# Patient Record
Sex: Male | Born: 1945 | Race: White | Hispanic: No | Marital: Married | Smoking: Former smoker
Health system: Southern US, Community
[De-identification: ages and names within clinical notes are randomized; demographics above are authoritative.]

## PROBLEM LIST (undated history)

## (undated) DIAGNOSIS — G8250 Quadriplegia, unspecified: Secondary | ICD-10-CM

## (undated) DIAGNOSIS — N529 Male erectile dysfunction, unspecified: Secondary | ICD-10-CM

## (undated) DIAGNOSIS — I2510 Atherosclerotic heart disease of native coronary artery without angina pectoris: Secondary | ICD-10-CM

## (undated) DIAGNOSIS — K5730 Diverticulosis of large intestine without perforation or abscess without bleeding: Secondary | ICD-10-CM

## (undated) DIAGNOSIS — R011 Cardiac murmur, unspecified: Secondary | ICD-10-CM

## (undated) DIAGNOSIS — M47812 Spondylosis without myelopathy or radiculopathy, cervical region: Secondary | ICD-10-CM

## (undated) DIAGNOSIS — K219 Gastro-esophageal reflux disease without esophagitis: Secondary | ICD-10-CM

## (undated) DIAGNOSIS — B029 Zoster without complications: Secondary | ICD-10-CM

## (undated) DIAGNOSIS — M5430 Sciatica, unspecified side: Secondary | ICD-10-CM

## (undated) DIAGNOSIS — M479 Spondylosis, unspecified: Secondary | ICD-10-CM

## (undated) DIAGNOSIS — I639 Cerebral infarction, unspecified: Secondary | ICD-10-CM

## (undated) DIAGNOSIS — D1803 Hemangioma of intra-abdominal structures: Secondary | ICD-10-CM

## (undated) DIAGNOSIS — M7061 Trochanteric bursitis, right hip: Secondary | ICD-10-CM

## (undated) HISTORY — DX: Sciatica, unspecified side: M54.30

## (undated) HISTORY — PX: TENDON REPAIR: SHX5111

## (undated) HISTORY — DX: Trochanteric bursitis, right hip: M70.61

## (undated) HISTORY — PX: CORONARY ANGIOPLASTY WITH STENT PLACEMENT: SHX49

## (undated) HISTORY — PX: PATENT FORAMEN OVALE CLOSURE: SHX2181

## (undated) HISTORY — DX: Quadriplegia, unspecified: G82.50

## (undated) HISTORY — DX: Diverticulosis of large intestine without perforation or abscess without bleeding: K57.30

## (undated) HISTORY — PX: HAND SURGERY: SHX662

## (undated) HISTORY — PX: KNEE ARTHROSCOPY W/ MENISCAL REPAIR: SHX1877

## (undated) HISTORY — DX: Gastro-esophageal reflux disease without esophagitis: K21.9

## (undated) HISTORY — DX: Spondylosis, unspecified: M47.9

## (undated) HISTORY — DX: Male erectile dysfunction, unspecified: N52.9

## (undated) HISTORY — DX: Hemangioma of intra-abdominal structures: D18.03

## (undated) HISTORY — PX: OTHER SURGICAL HISTORY: SHX169

## (undated) HISTORY — DX: Zoster without complications: B02.9

## (undated) HISTORY — DX: Spondylosis without myelopathy or radiculopathy, cervical region: M47.812

## (undated) HISTORY — PX: COLONOSCOPY: SHX174

---

## 1998-07-06 ENCOUNTER — Encounter: Payer: Self-pay | Admitting: Internal Medicine

## 1998-07-06 ENCOUNTER — Ambulatory Visit (HOSPITAL_COMMUNITY): Admission: RE | Admit: 1998-07-06 | Discharge: 1998-07-06 | Payer: Self-pay | Admitting: Internal Medicine

## 1998-07-12 ENCOUNTER — Encounter: Payer: Self-pay | Admitting: Neurosurgery

## 1998-07-12 ENCOUNTER — Ambulatory Visit (HOSPITAL_COMMUNITY): Admission: RE | Admit: 1998-07-12 | Discharge: 1998-07-12 | Payer: Self-pay | Admitting: Neurosurgery

## 1999-03-31 ENCOUNTER — Encounter: Payer: Self-pay | Admitting: Neurosurgery

## 1999-03-31 ENCOUNTER — Encounter: Admission: RE | Admit: 1999-03-31 | Discharge: 1999-03-31 | Payer: Self-pay | Admitting: Neurosurgery

## 2003-07-01 ENCOUNTER — Ambulatory Visit (HOSPITAL_COMMUNITY): Admission: RE | Admit: 2003-07-01 | Discharge: 2003-07-01 | Payer: Self-pay | Admitting: Gastroenterology

## 2004-05-14 ENCOUNTER — Encounter: Admission: RE | Admit: 2004-05-14 | Discharge: 2004-05-14 | Payer: Self-pay | Admitting: Internal Medicine

## 2004-05-19 ENCOUNTER — Ambulatory Visit (HOSPITAL_COMMUNITY): Admission: RE | Admit: 2004-05-19 | Discharge: 2004-05-19 | Payer: Self-pay | Admitting: Internal Medicine

## 2004-10-27 ENCOUNTER — Encounter: Admission: RE | Admit: 2004-10-27 | Discharge: 2004-10-27 | Payer: Self-pay | Admitting: Internal Medicine

## 2011-07-16 DIAGNOSIS — Z1331 Encounter for screening for depression: Secondary | ICD-10-CM | POA: Diagnosis not present

## 2011-07-16 DIAGNOSIS — Z136 Encounter for screening for cardiovascular disorders: Secondary | ICD-10-CM | POA: Diagnosis not present

## 2011-07-16 DIAGNOSIS — Z125 Encounter for screening for malignant neoplasm of prostate: Secondary | ICD-10-CM | POA: Diagnosis not present

## 2011-07-16 DIAGNOSIS — R030 Elevated blood-pressure reading, without diagnosis of hypertension: Secondary | ICD-10-CM | POA: Diagnosis not present

## 2011-07-16 DIAGNOSIS — R0609 Other forms of dyspnea: Secondary | ICD-10-CM | POA: Diagnosis not present

## 2011-07-16 DIAGNOSIS — Z0000 Encounter for general adult medical examination without abnormal findings: Secondary | ICD-10-CM | POA: Diagnosis not present

## 2011-07-16 DIAGNOSIS — Z23 Encounter for immunization: Secondary | ICD-10-CM | POA: Diagnosis not present

## 2011-07-16 DIAGNOSIS — K219 Gastro-esophageal reflux disease without esophagitis: Secondary | ICD-10-CM | POA: Diagnosis not present

## 2011-07-16 DIAGNOSIS — R0989 Other specified symptoms and signs involving the circulatory and respiratory systems: Secondary | ICD-10-CM | POA: Diagnosis not present

## 2011-08-13 DIAGNOSIS — I10 Essential (primary) hypertension: Secondary | ICD-10-CM | POA: Diagnosis not present

## 2012-01-06 DIAGNOSIS — K219 Gastro-esophageal reflux disease without esophagitis: Secondary | ICD-10-CM | POA: Diagnosis not present

## 2012-01-06 DIAGNOSIS — K589 Irritable bowel syndrome without diarrhea: Secondary | ICD-10-CM | POA: Diagnosis not present

## 2012-02-18 DIAGNOSIS — E785 Hyperlipidemia, unspecified: Secondary | ICD-10-CM | POA: Diagnosis not present

## 2012-02-18 DIAGNOSIS — R030 Elevated blood-pressure reading, without diagnosis of hypertension: Secondary | ICD-10-CM | POA: Diagnosis not present

## 2012-02-18 DIAGNOSIS — Z23 Encounter for immunization: Secondary | ICD-10-CM | POA: Diagnosis not present

## 2012-07-14 DIAGNOSIS — M25579 Pain in unspecified ankle and joints of unspecified foot: Secondary | ICD-10-CM | POA: Diagnosis not present

## 2012-07-14 DIAGNOSIS — M2140 Flat foot [pes planus] (acquired), unspecified foot: Secondary | ICD-10-CM | POA: Diagnosis not present

## 2012-08-04 DIAGNOSIS — E785 Hyperlipidemia, unspecified: Secondary | ICD-10-CM | POA: Diagnosis not present

## 2012-08-04 DIAGNOSIS — R030 Elevated blood-pressure reading, without diagnosis of hypertension: Secondary | ICD-10-CM | POA: Diagnosis not present

## 2012-08-11 DIAGNOSIS — M79609 Pain in unspecified limb: Secondary | ICD-10-CM | POA: Diagnosis not present

## 2012-08-11 DIAGNOSIS — Z1331 Encounter for screening for depression: Secondary | ICD-10-CM | POA: Diagnosis not present

## 2012-08-11 DIAGNOSIS — N529 Male erectile dysfunction, unspecified: Secondary | ICD-10-CM | POA: Diagnosis not present

## 2012-08-11 DIAGNOSIS — Z0000 Encounter for general adult medical examination without abnormal findings: Secondary | ICD-10-CM | POA: Diagnosis not present

## 2012-08-11 DIAGNOSIS — M25579 Pain in unspecified ankle and joints of unspecified foot: Secondary | ICD-10-CM | POA: Diagnosis not present

## 2012-08-17 DIAGNOSIS — M216X9 Other acquired deformities of unspecified foot: Secondary | ICD-10-CM | POA: Diagnosis not present

## 2012-08-17 DIAGNOSIS — G5730 Lesion of lateral popliteal nerve, unspecified lower limb: Secondary | ICD-10-CM | POA: Diagnosis not present

## 2012-08-22 DIAGNOSIS — M47817 Spondylosis without myelopathy or radiculopathy, lumbosacral region: Secondary | ICD-10-CM | POA: Diagnosis not present

## 2012-08-23 DIAGNOSIS — M25676 Stiffness of unspecified foot, not elsewhere classified: Secondary | ICD-10-CM | POA: Diagnosis not present

## 2012-08-23 DIAGNOSIS — M6281 Muscle weakness (generalized): Secondary | ICD-10-CM | POA: Diagnosis not present

## 2012-08-23 DIAGNOSIS — M25579 Pain in unspecified ankle and joints of unspecified foot: Secondary | ICD-10-CM | POA: Diagnosis not present

## 2012-08-23 DIAGNOSIS — M216X9 Other acquired deformities of unspecified foot: Secondary | ICD-10-CM | POA: Diagnosis not present

## 2012-08-23 DIAGNOSIS — M25673 Stiffness of unspecified ankle, not elsewhere classified: Secondary | ICD-10-CM | POA: Diagnosis not present

## 2012-08-25 DIAGNOSIS — M216X9 Other acquired deformities of unspecified foot: Secondary | ICD-10-CM | POA: Diagnosis not present

## 2012-08-25 DIAGNOSIS — M6281 Muscle weakness (generalized): Secondary | ICD-10-CM | POA: Diagnosis not present

## 2012-08-25 DIAGNOSIS — M25676 Stiffness of unspecified foot, not elsewhere classified: Secondary | ICD-10-CM | POA: Diagnosis not present

## 2012-08-25 DIAGNOSIS — M25579 Pain in unspecified ankle and joints of unspecified foot: Secondary | ICD-10-CM | POA: Diagnosis not present

## 2012-08-25 DIAGNOSIS — M25673 Stiffness of unspecified ankle, not elsewhere classified: Secondary | ICD-10-CM | POA: Diagnosis not present

## 2012-08-29 DIAGNOSIS — M25579 Pain in unspecified ankle and joints of unspecified foot: Secondary | ICD-10-CM | POA: Diagnosis not present

## 2012-08-29 DIAGNOSIS — M6281 Muscle weakness (generalized): Secondary | ICD-10-CM | POA: Diagnosis not present

## 2012-08-29 DIAGNOSIS — M216X9 Other acquired deformities of unspecified foot: Secondary | ICD-10-CM | POA: Diagnosis not present

## 2012-08-29 DIAGNOSIS — M25673 Stiffness of unspecified ankle, not elsewhere classified: Secondary | ICD-10-CM | POA: Diagnosis not present

## 2012-08-31 DIAGNOSIS — M25579 Pain in unspecified ankle and joints of unspecified foot: Secondary | ICD-10-CM | POA: Diagnosis not present

## 2012-08-31 DIAGNOSIS — M6281 Muscle weakness (generalized): Secondary | ICD-10-CM | POA: Diagnosis not present

## 2012-08-31 DIAGNOSIS — M216X9 Other acquired deformities of unspecified foot: Secondary | ICD-10-CM | POA: Diagnosis not present

## 2012-08-31 DIAGNOSIS — M25676 Stiffness of unspecified foot, not elsewhere classified: Secondary | ICD-10-CM | POA: Diagnosis not present

## 2012-08-31 DIAGNOSIS — M25673 Stiffness of unspecified ankle, not elsewhere classified: Secondary | ICD-10-CM | POA: Diagnosis not present

## 2012-09-12 DIAGNOSIS — M216X9 Other acquired deformities of unspecified foot: Secondary | ICD-10-CM | POA: Diagnosis not present

## 2012-09-12 DIAGNOSIS — M25676 Stiffness of unspecified foot, not elsewhere classified: Secondary | ICD-10-CM | POA: Diagnosis not present

## 2012-09-12 DIAGNOSIS — M25579 Pain in unspecified ankle and joints of unspecified foot: Secondary | ICD-10-CM | POA: Diagnosis not present

## 2012-09-12 DIAGNOSIS — M25673 Stiffness of unspecified ankle, not elsewhere classified: Secondary | ICD-10-CM | POA: Diagnosis not present

## 2012-09-12 DIAGNOSIS — M6281 Muscle weakness (generalized): Secondary | ICD-10-CM | POA: Diagnosis not present

## 2012-09-14 DIAGNOSIS — M25579 Pain in unspecified ankle and joints of unspecified foot: Secondary | ICD-10-CM | POA: Diagnosis not present

## 2012-09-14 DIAGNOSIS — M216X9 Other acquired deformities of unspecified foot: Secondary | ICD-10-CM | POA: Diagnosis not present

## 2012-09-14 DIAGNOSIS — M6281 Muscle weakness (generalized): Secondary | ICD-10-CM | POA: Diagnosis not present

## 2012-09-14 DIAGNOSIS — M25676 Stiffness of unspecified foot, not elsewhere classified: Secondary | ICD-10-CM | POA: Diagnosis not present

## 2012-09-28 DIAGNOSIS — M6281 Muscle weakness (generalized): Secondary | ICD-10-CM | POA: Diagnosis not present

## 2012-09-28 DIAGNOSIS — M25676 Stiffness of unspecified foot, not elsewhere classified: Secondary | ICD-10-CM | POA: Diagnosis not present

## 2012-09-28 DIAGNOSIS — M216X9 Other acquired deformities of unspecified foot: Secondary | ICD-10-CM | POA: Diagnosis not present

## 2012-09-28 DIAGNOSIS — M25673 Stiffness of unspecified ankle, not elsewhere classified: Secondary | ICD-10-CM | POA: Diagnosis not present

## 2012-09-28 DIAGNOSIS — M25579 Pain in unspecified ankle and joints of unspecified foot: Secondary | ICD-10-CM | POA: Diagnosis not present

## 2012-11-14 DIAGNOSIS — S93499A Sprain of other ligament of unspecified ankle, initial encounter: Secondary | ICD-10-CM | POA: Diagnosis not present

## 2012-11-14 DIAGNOSIS — S96819A Strain of other specified muscles and tendons at ankle and foot level, unspecified foot, initial encounter: Secondary | ICD-10-CM | POA: Diagnosis not present

## 2012-11-18 DIAGNOSIS — S96819A Strain of other specified muscles and tendons at ankle and foot level, unspecified foot, initial encounter: Secondary | ICD-10-CM | POA: Diagnosis not present

## 2012-11-21 DIAGNOSIS — S8990XA Unspecified injury of unspecified lower leg, initial encounter: Secondary | ICD-10-CM | POA: Diagnosis not present

## 2012-11-21 DIAGNOSIS — S99929A Unspecified injury of unspecified foot, initial encounter: Secondary | ICD-10-CM | POA: Diagnosis not present

## 2012-11-21 DIAGNOSIS — G8918 Other acute postprocedural pain: Secondary | ICD-10-CM | POA: Diagnosis not present

## 2012-11-21 DIAGNOSIS — IMO0002 Reserved for concepts with insufficient information to code with codable children: Secondary | ICD-10-CM | POA: Diagnosis not present

## 2012-11-21 DIAGNOSIS — M6240 Contracture of muscle, unspecified site: Secondary | ICD-10-CM | POA: Diagnosis not present

## 2012-11-21 DIAGNOSIS — M66239 Spontaneous rupture of extensor tendons, unspecified forearm: Secondary | ICD-10-CM | POA: Diagnosis not present

## 2012-12-07 DIAGNOSIS — S96819A Strain of other specified muscles and tendons at ankle and foot level, unspecified foot, initial encounter: Secondary | ICD-10-CM | POA: Diagnosis not present

## 2012-12-07 DIAGNOSIS — S93499A Sprain of other ligament of unspecified ankle, initial encounter: Secondary | ICD-10-CM | POA: Diagnosis not present

## 2012-12-07 DIAGNOSIS — Z4789 Encounter for other orthopedic aftercare: Secondary | ICD-10-CM | POA: Diagnosis not present

## 2012-12-22 DIAGNOSIS — Z4789 Encounter for other orthopedic aftercare: Secondary | ICD-10-CM | POA: Diagnosis not present

## 2012-12-27 DIAGNOSIS — M25579 Pain in unspecified ankle and joints of unspecified foot: Secondary | ICD-10-CM | POA: Diagnosis not present

## 2012-12-29 DIAGNOSIS — M25579 Pain in unspecified ankle and joints of unspecified foot: Secondary | ICD-10-CM | POA: Diagnosis not present

## 2013-01-02 DIAGNOSIS — M25579 Pain in unspecified ankle and joints of unspecified foot: Secondary | ICD-10-CM | POA: Diagnosis not present

## 2013-01-04 DIAGNOSIS — M25579 Pain in unspecified ankle and joints of unspecified foot: Secondary | ICD-10-CM | POA: Diagnosis not present

## 2013-01-09 DIAGNOSIS — M25579 Pain in unspecified ankle and joints of unspecified foot: Secondary | ICD-10-CM | POA: Diagnosis not present

## 2013-01-22 DIAGNOSIS — M25579 Pain in unspecified ankle and joints of unspecified foot: Secondary | ICD-10-CM | POA: Diagnosis not present

## 2013-01-23 DIAGNOSIS — M25579 Pain in unspecified ankle and joints of unspecified foot: Secondary | ICD-10-CM | POA: Diagnosis not present

## 2013-01-24 DIAGNOSIS — H2589 Other age-related cataract: Secondary | ICD-10-CM | POA: Diagnosis not present

## 2013-01-29 DIAGNOSIS — M25579 Pain in unspecified ankle and joints of unspecified foot: Secondary | ICD-10-CM | POA: Diagnosis not present

## 2013-02-05 DIAGNOSIS — M25579 Pain in unspecified ankle and joints of unspecified foot: Secondary | ICD-10-CM | POA: Diagnosis not present

## 2013-03-07 DIAGNOSIS — Z23 Encounter for immunization: Secondary | ICD-10-CM | POA: Diagnosis not present

## 2013-08-17 DIAGNOSIS — N529 Male erectile dysfunction, unspecified: Secondary | ICD-10-CM | POA: Diagnosis not present

## 2013-08-17 DIAGNOSIS — Z125 Encounter for screening for malignant neoplasm of prostate: Secondary | ICD-10-CM | POA: Diagnosis not present

## 2013-08-17 DIAGNOSIS — Z1331 Encounter for screening for depression: Secondary | ICD-10-CM | POA: Diagnosis not present

## 2013-08-17 DIAGNOSIS — Z23 Encounter for immunization: Secondary | ICD-10-CM | POA: Diagnosis not present

## 2013-08-17 DIAGNOSIS — Z136 Encounter for screening for cardiovascular disorders: Secondary | ICD-10-CM | POA: Diagnosis not present

## 2013-08-17 DIAGNOSIS — Z0000 Encounter for general adult medical examination without abnormal findings: Secondary | ICD-10-CM | POA: Diagnosis not present

## 2013-10-05 DIAGNOSIS — K5730 Diverticulosis of large intestine without perforation or abscess without bleeding: Secondary | ICD-10-CM | POA: Diagnosis not present

## 2013-10-05 DIAGNOSIS — Z1211 Encounter for screening for malignant neoplasm of colon: Secondary | ICD-10-CM | POA: Diagnosis not present

## 2013-10-05 LAB — HM COLONOSCOPY

## 2014-04-16 DIAGNOSIS — R200 Anesthesia of skin: Secondary | ICD-10-CM | POA: Diagnosis not present

## 2014-08-28 DIAGNOSIS — Z0000 Encounter for general adult medical examination without abnormal findings: Secondary | ICD-10-CM | POA: Diagnosis not present

## 2014-08-28 DIAGNOSIS — Z1389 Encounter for screening for other disorder: Secondary | ICD-10-CM | POA: Diagnosis not present

## 2014-08-28 DIAGNOSIS — M25551 Pain in right hip: Secondary | ICD-10-CM | POA: Diagnosis not present

## 2014-08-28 DIAGNOSIS — N5201 Erectile dysfunction due to arterial insufficiency: Secondary | ICD-10-CM | POA: Diagnosis not present

## 2014-09-06 DIAGNOSIS — M7071 Other bursitis of hip, right hip: Secondary | ICD-10-CM | POA: Diagnosis not present

## 2014-10-09 DIAGNOSIS — M7061 Trochanteric bursitis, right hip: Secondary | ICD-10-CM | POA: Diagnosis not present

## 2014-11-27 DIAGNOSIS — M25551 Pain in right hip: Secondary | ICD-10-CM | POA: Diagnosis not present

## 2014-11-27 DIAGNOSIS — M7061 Trochanteric bursitis, right hip: Secondary | ICD-10-CM | POA: Diagnosis not present

## 2014-12-18 DIAGNOSIS — K219 Gastro-esophageal reflux disease without esophagitis: Secondary | ICD-10-CM | POA: Diagnosis not present

## 2014-12-18 DIAGNOSIS — R011 Cardiac murmur, unspecified: Secondary | ICD-10-CM | POA: Diagnosis not present

## 2014-12-18 DIAGNOSIS — R079 Chest pain, unspecified: Secondary | ICD-10-CM | POA: Diagnosis not present

## 2014-12-24 ENCOUNTER — Other Ambulatory Visit: Payer: Self-pay

## 2014-12-24 ENCOUNTER — Other Ambulatory Visit (HOSPITAL_COMMUNITY): Payer: Self-pay | Admitting: Internal Medicine

## 2014-12-24 ENCOUNTER — Ambulatory Visit (HOSPITAL_COMMUNITY): Payer: Medicare Other | Attending: Cardiology

## 2014-12-24 DIAGNOSIS — I517 Cardiomegaly: Secondary | ICD-10-CM | POA: Diagnosis not present

## 2014-12-24 DIAGNOSIS — I352 Nonrheumatic aortic (valve) stenosis with insufficiency: Secondary | ICD-10-CM | POA: Diagnosis not present

## 2014-12-24 DIAGNOSIS — R011 Cardiac murmur, unspecified: Secondary | ICD-10-CM

## 2014-12-24 DIAGNOSIS — I059 Rheumatic mitral valve disease, unspecified: Secondary | ICD-10-CM | POA: Diagnosis not present

## 2014-12-24 DIAGNOSIS — I253 Aneurysm of heart: Secondary | ICD-10-CM | POA: Diagnosis not present

## 2014-12-27 DIAGNOSIS — R931 Abnormal findings on diagnostic imaging of heart and coronary circulation: Secondary | ICD-10-CM | POA: Diagnosis not present

## 2014-12-27 DIAGNOSIS — I350 Nonrheumatic aortic (valve) stenosis: Secondary | ICD-10-CM | POA: Diagnosis not present

## 2014-12-27 DIAGNOSIS — R0789 Other chest pain: Secondary | ICD-10-CM | POA: Diagnosis not present

## 2014-12-30 DIAGNOSIS — I253 Aneurysm of heart: Secondary | ICD-10-CM | POA: Diagnosis not present

## 2014-12-30 DIAGNOSIS — I350 Nonrheumatic aortic (valve) stenosis: Secondary | ICD-10-CM | POA: Diagnosis not present

## 2014-12-30 DIAGNOSIS — R0789 Other chest pain: Secondary | ICD-10-CM | POA: Diagnosis not present

## 2015-01-14 DIAGNOSIS — Q2112 Patent foramen ovale: Secondary | ICD-10-CM | POA: Insufficient documentation

## 2015-01-14 DIAGNOSIS — I253 Aneurysm of heart: Secondary | ICD-10-CM | POA: Insufficient documentation

## 2015-01-15 DIAGNOSIS — Z9189 Other specified personal risk factors, not elsewhere classified: Secondary | ICD-10-CM | POA: Diagnosis not present

## 2015-01-15 DIAGNOSIS — I350 Nonrheumatic aortic (valve) stenosis: Secondary | ICD-10-CM | POA: Diagnosis not present

## 2015-01-15 DIAGNOSIS — Q211 Atrial septal defect: Secondary | ICD-10-CM | POA: Diagnosis not present

## 2015-02-25 DIAGNOSIS — H5203 Hypermetropia, bilateral: Secondary | ICD-10-CM | POA: Diagnosis not present

## 2015-02-25 DIAGNOSIS — H43393 Other vitreous opacities, bilateral: Secondary | ICD-10-CM | POA: Diagnosis not present

## 2015-02-25 DIAGNOSIS — H52223 Regular astigmatism, bilateral: Secondary | ICD-10-CM | POA: Diagnosis not present

## 2015-02-25 DIAGNOSIS — H25813 Combined forms of age-related cataract, bilateral: Secondary | ICD-10-CM | POA: Diagnosis not present

## 2015-02-25 DIAGNOSIS — H524 Presbyopia: Secondary | ICD-10-CM | POA: Diagnosis not present

## 2015-03-24 DIAGNOSIS — M7071 Other bursitis of hip, right hip: Secondary | ICD-10-CM | POA: Diagnosis not present

## 2015-04-04 DIAGNOSIS — D2239 Melanocytic nevi of other parts of face: Secondary | ICD-10-CM | POA: Diagnosis not present

## 2015-04-04 DIAGNOSIS — L821 Other seborrheic keratosis: Secondary | ICD-10-CM | POA: Diagnosis not present

## 2015-04-04 DIAGNOSIS — D485 Neoplasm of uncertain behavior of skin: Secondary | ICD-10-CM | POA: Diagnosis not present

## 2015-04-04 DIAGNOSIS — L820 Inflamed seborrheic keratosis: Secondary | ICD-10-CM | POA: Diagnosis not present

## 2015-06-04 DIAGNOSIS — M79672 Pain in left foot: Secondary | ICD-10-CM | POA: Diagnosis not present

## 2015-06-13 DIAGNOSIS — M7062 Trochanteric bursitis, left hip: Secondary | ICD-10-CM | POA: Diagnosis not present

## 2015-08-20 DIAGNOSIS — M7062 Trochanteric bursitis, left hip: Secondary | ICD-10-CM | POA: Diagnosis not present

## 2015-08-27 DIAGNOSIS — M79605 Pain in left leg: Secondary | ICD-10-CM | POA: Diagnosis not present

## 2015-08-27 DIAGNOSIS — M7072 Other bursitis of hip, left hip: Secondary | ICD-10-CM | POA: Diagnosis not present

## 2015-08-29 DIAGNOSIS — Z131 Encounter for screening for diabetes mellitus: Secondary | ICD-10-CM | POA: Diagnosis not present

## 2015-08-29 DIAGNOSIS — I350 Nonrheumatic aortic (valve) stenosis: Secondary | ICD-10-CM | POA: Diagnosis not present

## 2015-08-29 DIAGNOSIS — Z0000 Encounter for general adult medical examination without abnormal findings: Secondary | ICD-10-CM | POA: Diagnosis not present

## 2015-08-29 DIAGNOSIS — Z1389 Encounter for screening for other disorder: Secondary | ICD-10-CM | POA: Diagnosis not present

## 2015-08-29 DIAGNOSIS — E7800 Pure hypercholesterolemia, unspecified: Secondary | ICD-10-CM | POA: Diagnosis not present

## 2015-08-29 DIAGNOSIS — Q211 Atrial septal defect: Secondary | ICD-10-CM | POA: Diagnosis not present

## 2015-09-08 DIAGNOSIS — M7072 Other bursitis of hip, left hip: Secondary | ICD-10-CM | POA: Diagnosis not present

## 2015-09-09 DIAGNOSIS — M7072 Other bursitis of hip, left hip: Secondary | ICD-10-CM | POA: Diagnosis not present

## 2015-10-03 DIAGNOSIS — M7072 Other bursitis of hip, left hip: Secondary | ICD-10-CM | POA: Diagnosis not present

## 2015-12-03 DIAGNOSIS — M7072 Other bursitis of hip, left hip: Secondary | ICD-10-CM | POA: Diagnosis not present

## 2016-01-14 DIAGNOSIS — I350 Nonrheumatic aortic (valve) stenosis: Secondary | ICD-10-CM | POA: Diagnosis not present

## 2016-01-14 DIAGNOSIS — Z87891 Personal history of nicotine dependence: Secondary | ICD-10-CM | POA: Diagnosis not present

## 2016-01-14 DIAGNOSIS — Q211 Atrial septal defect: Secondary | ICD-10-CM | POA: Diagnosis not present

## 2016-02-11 DIAGNOSIS — K115 Sialolithiasis: Secondary | ICD-10-CM | POA: Diagnosis not present

## 2016-02-23 DIAGNOSIS — Z23 Encounter for immunization: Secondary | ICD-10-CM | POA: Diagnosis not present

## 2016-03-23 DIAGNOSIS — R070 Pain in throat: Secondary | ICD-10-CM | POA: Diagnosis not present

## 2016-06-17 DIAGNOSIS — Z87891 Personal history of nicotine dependence: Secondary | ICD-10-CM | POA: Diagnosis not present

## 2016-06-17 DIAGNOSIS — K219 Gastro-esophageal reflux disease without esophagitis: Secondary | ICD-10-CM | POA: Insufficient documentation

## 2016-08-31 DIAGNOSIS — E7800 Pure hypercholesterolemia, unspecified: Secondary | ICD-10-CM | POA: Diagnosis not present

## 2016-08-31 DIAGNOSIS — Z1389 Encounter for screening for other disorder: Secondary | ICD-10-CM | POA: Diagnosis not present

## 2016-08-31 DIAGNOSIS — N5201 Erectile dysfunction due to arterial insufficiency: Secondary | ICD-10-CM | POA: Diagnosis not present

## 2016-08-31 DIAGNOSIS — K219 Gastro-esophageal reflux disease without esophagitis: Secondary | ICD-10-CM | POA: Diagnosis not present

## 2016-08-31 DIAGNOSIS — Z0000 Encounter for general adult medical examination without abnormal findings: Secondary | ICD-10-CM | POA: Diagnosis not present

## 2016-09-02 DIAGNOSIS — Z125 Encounter for screening for malignant neoplasm of prostate: Secondary | ICD-10-CM | POA: Diagnosis not present

## 2017-01-03 DIAGNOSIS — H35371 Puckering of macula, right eye: Secondary | ICD-10-CM | POA: Diagnosis not present

## 2017-01-03 DIAGNOSIS — H52222 Regular astigmatism, left eye: Secondary | ICD-10-CM | POA: Diagnosis not present

## 2017-01-03 DIAGNOSIS — H43813 Vitreous degeneration, bilateral: Secondary | ICD-10-CM | POA: Diagnosis not present

## 2017-01-03 DIAGNOSIS — H5203 Hypermetropia, bilateral: Secondary | ICD-10-CM | POA: Diagnosis not present

## 2017-01-03 DIAGNOSIS — H2513 Age-related nuclear cataract, bilateral: Secondary | ICD-10-CM | POA: Diagnosis not present

## 2017-01-03 DIAGNOSIS — H524 Presbyopia: Secondary | ICD-10-CM | POA: Diagnosis not present

## 2017-01-25 DIAGNOSIS — H35371 Puckering of macula, right eye: Secondary | ICD-10-CM | POA: Diagnosis not present

## 2017-05-05 DIAGNOSIS — M1612 Unilateral primary osteoarthritis, left hip: Secondary | ICD-10-CM | POA: Diagnosis not present

## 2017-05-05 DIAGNOSIS — M7072 Other bursitis of hip, left hip: Secondary | ICD-10-CM | POA: Diagnosis not present

## 2017-05-05 DIAGNOSIS — M7062 Trochanteric bursitis, left hip: Secondary | ICD-10-CM | POA: Diagnosis not present

## 2017-05-05 DIAGNOSIS — M25552 Pain in left hip: Secondary | ICD-10-CM | POA: Diagnosis not present

## 2017-06-24 DIAGNOSIS — I10 Essential (primary) hypertension: Secondary | ICD-10-CM | POA: Diagnosis not present

## 2017-06-24 DIAGNOSIS — I6523 Occlusion and stenosis of bilateral carotid arteries: Secondary | ICD-10-CM | POA: Diagnosis not present

## 2017-06-24 DIAGNOSIS — E785 Hyperlipidemia, unspecified: Secondary | ICD-10-CM | POA: Diagnosis not present

## 2017-06-24 DIAGNOSIS — R002 Palpitations: Secondary | ICD-10-CM | POA: Diagnosis not present

## 2017-06-24 DIAGNOSIS — R4701 Aphasia: Secondary | ICD-10-CM | POA: Diagnosis not present

## 2017-06-24 DIAGNOSIS — G459 Transient cerebral ischemic attack, unspecified: Secondary | ICD-10-CM | POA: Diagnosis not present

## 2017-06-24 DIAGNOSIS — Z7982 Long term (current) use of aspirin: Secondary | ICD-10-CM | POA: Diagnosis not present

## 2017-06-24 DIAGNOSIS — Q211 Atrial septal defect: Secondary | ICD-10-CM | POA: Diagnosis not present

## 2017-06-24 DIAGNOSIS — R51 Headache: Secondary | ICD-10-CM | POA: Diagnosis not present

## 2017-06-24 DIAGNOSIS — Z87891 Personal history of nicotine dependence: Secondary | ICD-10-CM | POA: Diagnosis not present

## 2017-06-25 DIAGNOSIS — G459 Transient cerebral ischemic attack, unspecified: Secondary | ICD-10-CM | POA: Diagnosis not present

## 2017-06-25 DIAGNOSIS — I6523 Occlusion and stenosis of bilateral carotid arteries: Secondary | ICD-10-CM | POA: Diagnosis not present

## 2017-06-27 DIAGNOSIS — I639 Cerebral infarction, unspecified: Secondary | ICD-10-CM | POA: Insufficient documentation

## 2017-07-04 DIAGNOSIS — I350 Nonrheumatic aortic (valve) stenosis: Secondary | ICD-10-CM | POA: Diagnosis not present

## 2017-07-04 DIAGNOSIS — I6349 Cerebral infarction due to embolism of other cerebral artery: Secondary | ICD-10-CM | POA: Diagnosis not present

## 2017-07-04 DIAGNOSIS — Q211 Atrial septal defect: Secondary | ICD-10-CM | POA: Diagnosis not present

## 2017-07-27 DIAGNOSIS — I63439 Cerebral infarction due to embolism of unspecified posterior cerebral artery: Secondary | ICD-10-CM | POA: Diagnosis not present

## 2017-07-27 DIAGNOSIS — Z01812 Encounter for preprocedural laboratory examination: Secondary | ICD-10-CM | POA: Diagnosis not present

## 2017-07-27 DIAGNOSIS — I350 Nonrheumatic aortic (valve) stenosis: Secondary | ICD-10-CM | POA: Diagnosis not present

## 2017-07-27 DIAGNOSIS — Q211 Atrial septal defect: Secondary | ICD-10-CM | POA: Diagnosis not present

## 2017-08-01 DIAGNOSIS — M1990 Unspecified osteoarthritis, unspecified site: Secondary | ICD-10-CM | POA: Diagnosis present

## 2017-08-01 DIAGNOSIS — Z7982 Long term (current) use of aspirin: Secondary | ICD-10-CM | POA: Diagnosis not present

## 2017-08-01 DIAGNOSIS — Z79899 Other long term (current) drug therapy: Secondary | ICD-10-CM | POA: Diagnosis not present

## 2017-08-01 DIAGNOSIS — Q211 Atrial septal defect: Secondary | ICD-10-CM | POA: Diagnosis not present

## 2017-08-01 DIAGNOSIS — H9190 Unspecified hearing loss, unspecified ear: Secondary | ICD-10-CM | POA: Diagnosis present

## 2017-08-01 DIAGNOSIS — I350 Nonrheumatic aortic (valve) stenosis: Secondary | ICD-10-CM | POA: Diagnosis not present

## 2017-08-01 DIAGNOSIS — Z8673 Personal history of transient ischemic attack (TIA), and cerebral infarction without residual deficits: Secondary | ICD-10-CM | POA: Diagnosis not present

## 2017-08-01 DIAGNOSIS — I10 Essential (primary) hypertension: Secondary | ICD-10-CM | POA: Diagnosis present

## 2017-08-01 DIAGNOSIS — Z87891 Personal history of nicotine dependence: Secondary | ICD-10-CM | POA: Diagnosis not present

## 2017-08-01 DIAGNOSIS — H547 Unspecified visual loss: Secondary | ICD-10-CM | POA: Diagnosis present

## 2017-08-01 DIAGNOSIS — I63439 Cerebral infarction due to embolism of unspecified posterior cerebral artery: Secondary | ICD-10-CM | POA: Diagnosis not present

## 2017-08-01 DIAGNOSIS — E119 Type 2 diabetes mellitus without complications: Secondary | ICD-10-CM | POA: Diagnosis present

## 2017-08-01 DIAGNOSIS — Z01812 Encounter for preprocedural laboratory examination: Secondary | ICD-10-CM | POA: Diagnosis not present

## 2017-08-01 DIAGNOSIS — G8220 Paraplegia, unspecified: Secondary | ICD-10-CM | POA: Diagnosis present

## 2017-08-01 DIAGNOSIS — K219 Gastro-esophageal reflux disease without esophagitis: Secondary | ICD-10-CM | POA: Diagnosis present

## 2017-08-01 DIAGNOSIS — I472 Ventricular tachycardia: Secondary | ICD-10-CM | POA: Diagnosis not present

## 2017-08-01 DIAGNOSIS — I25118 Atherosclerotic heart disease of native coronary artery with other forms of angina pectoris: Secondary | ICD-10-CM | POA: Diagnosis not present

## 2017-08-01 DIAGNOSIS — I253 Aneurysm of heart: Secondary | ICD-10-CM | POA: Diagnosis present

## 2017-08-01 DIAGNOSIS — I6349 Cerebral infarction due to embolism of other cerebral artery: Secondary | ICD-10-CM | POA: Diagnosis not present

## 2017-08-01 DIAGNOSIS — I2510 Atherosclerotic heart disease of native coronary artery without angina pectoris: Secondary | ICD-10-CM | POA: Diagnosis not present

## 2017-08-01 DIAGNOSIS — I25119 Atherosclerotic heart disease of native coronary artery with unspecified angina pectoris: Secondary | ICD-10-CM | POA: Diagnosis not present

## 2017-08-01 DIAGNOSIS — G8929 Other chronic pain: Secondary | ICD-10-CM | POA: Diagnosis present

## 2017-08-02 DIAGNOSIS — Z01812 Encounter for preprocedural laboratory examination: Secondary | ICD-10-CM | POA: Diagnosis not present

## 2017-08-02 DIAGNOSIS — I63439 Cerebral infarction due to embolism of unspecified posterior cerebral artery: Secondary | ICD-10-CM | POA: Diagnosis not present

## 2017-08-02 DIAGNOSIS — I350 Nonrheumatic aortic (valve) stenosis: Secondary | ICD-10-CM | POA: Diagnosis not present

## 2017-08-02 DIAGNOSIS — I25119 Atherosclerotic heart disease of native coronary artery with unspecified angina pectoris: Secondary | ICD-10-CM | POA: Diagnosis not present

## 2017-08-02 DIAGNOSIS — I472 Ventricular tachycardia: Secondary | ICD-10-CM | POA: Diagnosis not present

## 2017-08-02 DIAGNOSIS — Q211 Atrial septal defect: Secondary | ICD-10-CM | POA: Diagnosis not present

## 2017-08-02 DIAGNOSIS — I2510 Atherosclerotic heart disease of native coronary artery without angina pectoris: Secondary | ICD-10-CM | POA: Insufficient documentation

## 2017-08-02 DIAGNOSIS — I6349 Cerebral infarction due to embolism of other cerebral artery: Secondary | ICD-10-CM | POA: Diagnosis not present

## 2017-09-14 DIAGNOSIS — K219 Gastro-esophageal reflux disease without esophagitis: Secondary | ICD-10-CM | POA: Diagnosis not present

## 2017-09-14 DIAGNOSIS — Z0000 Encounter for general adult medical examination without abnormal findings: Secondary | ICD-10-CM | POA: Diagnosis not present

## 2017-09-14 DIAGNOSIS — E7800 Pure hypercholesterolemia, unspecified: Secondary | ICD-10-CM | POA: Diagnosis not present

## 2017-09-14 DIAGNOSIS — Z23 Encounter for immunization: Secondary | ICD-10-CM | POA: Diagnosis not present

## 2017-09-14 DIAGNOSIS — Z79899 Other long term (current) drug therapy: Secondary | ICD-10-CM | POA: Diagnosis not present

## 2017-09-14 DIAGNOSIS — Q211 Atrial septal defect: Secondary | ICD-10-CM | POA: Diagnosis not present

## 2017-09-14 DIAGNOSIS — Z8673 Personal history of transient ischemic attack (TIA), and cerebral infarction without residual deficits: Secondary | ICD-10-CM | POA: Diagnosis not present

## 2017-09-14 DIAGNOSIS — I2510 Atherosclerotic heart disease of native coronary artery without angina pectoris: Secondary | ICD-10-CM | POA: Diagnosis not present

## 2017-09-14 DIAGNOSIS — Z1389 Encounter for screening for other disorder: Secondary | ICD-10-CM | POA: Diagnosis not present

## 2017-11-09 DIAGNOSIS — I351 Nonrheumatic aortic (valve) insufficiency: Secondary | ICD-10-CM | POA: Diagnosis not present

## 2017-11-09 DIAGNOSIS — I2510 Atherosclerotic heart disease of native coronary artery without angina pectoris: Secondary | ICD-10-CM | POA: Diagnosis not present

## 2017-11-09 DIAGNOSIS — I350 Nonrheumatic aortic (valve) stenosis: Secondary | ICD-10-CM | POA: Diagnosis not present

## 2017-11-09 DIAGNOSIS — Z48812 Encounter for surgical aftercare following surgery on the circulatory system: Secondary | ICD-10-CM | POA: Diagnosis not present

## 2017-11-09 DIAGNOSIS — Z01812 Encounter for preprocedural laboratory examination: Secondary | ICD-10-CM | POA: Diagnosis not present

## 2017-11-09 DIAGNOSIS — Z8774 Personal history of (corrected) congenital malformations of heart and circulatory system: Secondary | ICD-10-CM | POA: Diagnosis not present

## 2017-11-09 DIAGNOSIS — I519 Heart disease, unspecified: Secondary | ICD-10-CM | POA: Diagnosis not present

## 2017-11-09 DIAGNOSIS — I517 Cardiomegaly: Secondary | ICD-10-CM | POA: Diagnosis not present

## 2017-11-09 DIAGNOSIS — Q211 Atrial septal defect: Secondary | ICD-10-CM | POA: Diagnosis not present

## 2017-11-09 DIAGNOSIS — I63439 Cerebral infarction due to embolism of unspecified posterior cerebral artery: Secondary | ICD-10-CM | POA: Diagnosis not present

## 2017-11-09 DIAGNOSIS — Z87891 Personal history of nicotine dependence: Secondary | ICD-10-CM | POA: Diagnosis not present

## 2017-11-09 DIAGNOSIS — Z955 Presence of coronary angioplasty implant and graft: Secondary | ICD-10-CM | POA: Diagnosis not present

## 2018-02-09 DIAGNOSIS — R2242 Localized swelling, mass and lump, left lower limb: Secondary | ICD-10-CM | POA: Diagnosis not present

## 2018-02-09 DIAGNOSIS — M79672 Pain in left foot: Secondary | ICD-10-CM | POA: Diagnosis not present

## 2018-02-10 DIAGNOSIS — Z23 Encounter for immunization: Secondary | ICD-10-CM | POA: Diagnosis not present

## 2018-03-10 DIAGNOSIS — M67472 Ganglion, left ankle and foot: Secondary | ICD-10-CM | POA: Diagnosis not present

## 2018-03-15 DIAGNOSIS — M545 Low back pain: Secondary | ICD-10-CM | POA: Diagnosis not present

## 2018-03-15 DIAGNOSIS — M25552 Pain in left hip: Secondary | ICD-10-CM | POA: Diagnosis not present

## 2018-03-30 DIAGNOSIS — M25552 Pain in left hip: Secondary | ICD-10-CM | POA: Diagnosis not present

## 2018-03-30 DIAGNOSIS — M545 Low back pain: Secondary | ICD-10-CM | POA: Diagnosis not present

## 2018-04-03 DIAGNOSIS — M25552 Pain in left hip: Secondary | ICD-10-CM | POA: Diagnosis not present

## 2018-09-18 DIAGNOSIS — I2510 Atherosclerotic heart disease of native coronary artery without angina pectoris: Secondary | ICD-10-CM | POA: Diagnosis not present

## 2018-09-18 DIAGNOSIS — Z1389 Encounter for screening for other disorder: Secondary | ICD-10-CM | POA: Diagnosis not present

## 2018-09-18 DIAGNOSIS — K219 Gastro-esophageal reflux disease without esophagitis: Secondary | ICD-10-CM | POA: Diagnosis not present

## 2018-09-18 DIAGNOSIS — Z0000 Encounter for general adult medical examination without abnormal findings: Secondary | ICD-10-CM | POA: Diagnosis not present

## 2018-09-18 DIAGNOSIS — E7800 Pure hypercholesterolemia, unspecified: Secondary | ICD-10-CM | POA: Diagnosis not present

## 2018-09-27 DIAGNOSIS — I63439 Cerebral infarction due to embolism of unspecified posterior cerebral artery: Secondary | ICD-10-CM | POA: Diagnosis not present

## 2018-09-27 DIAGNOSIS — I2510 Atherosclerotic heart disease of native coronary artery without angina pectoris: Secondary | ICD-10-CM | POA: Diagnosis not present

## 2018-09-27 DIAGNOSIS — Q211 Atrial septal defect: Secondary | ICD-10-CM | POA: Diagnosis not present

## 2018-10-27 DIAGNOSIS — I351 Nonrheumatic aortic (valve) insufficiency: Secondary | ICD-10-CM | POA: Diagnosis not present

## 2018-10-27 DIAGNOSIS — I517 Cardiomegaly: Secondary | ICD-10-CM | POA: Diagnosis not present

## 2018-10-27 DIAGNOSIS — Q211 Atrial septal defect: Secondary | ICD-10-CM | POA: Diagnosis not present

## 2018-10-27 DIAGNOSIS — Z8774 Personal history of (corrected) congenital malformations of heart and circulatory system: Secondary | ICD-10-CM | POA: Diagnosis not present

## 2018-10-27 DIAGNOSIS — I2510 Atherosclerotic heart disease of native coronary artery without angina pectoris: Secondary | ICD-10-CM | POA: Diagnosis not present

## 2018-10-27 DIAGNOSIS — I63439 Cerebral infarction due to embolism of unspecified posterior cerebral artery: Secondary | ICD-10-CM | POA: Diagnosis not present

## 2018-11-17 DIAGNOSIS — R222 Localized swelling, mass and lump, trunk: Secondary | ICD-10-CM | POA: Diagnosis not present

## 2018-11-21 DIAGNOSIS — R222 Localized swelling, mass and lump, trunk: Secondary | ICD-10-CM | POA: Diagnosis not present

## 2019-01-16 DIAGNOSIS — D179 Benign lipomatous neoplasm, unspecified: Secondary | ICD-10-CM | POA: Diagnosis not present

## 2019-01-16 DIAGNOSIS — D1779 Benign lipomatous neoplasm of other sites: Secondary | ICD-10-CM | POA: Diagnosis not present

## 2019-02-05 DIAGNOSIS — Z23 Encounter for immunization: Secondary | ICD-10-CM | POA: Diagnosis not present

## 2019-04-04 DIAGNOSIS — Q211 Atrial septal defect: Secondary | ICD-10-CM | POA: Diagnosis not present

## 2019-04-04 DIAGNOSIS — I63439 Cerebral infarction due to embolism of unspecified posterior cerebral artery: Secondary | ICD-10-CM | POA: Diagnosis not present

## 2019-04-04 DIAGNOSIS — I2510 Atherosclerotic heart disease of native coronary artery without angina pectoris: Secondary | ICD-10-CM | POA: Diagnosis not present

## 2019-04-05 DIAGNOSIS — H5203 Hypermetropia, bilateral: Secondary | ICD-10-CM | POA: Diagnosis not present

## 2019-04-05 DIAGNOSIS — H52223 Regular astigmatism, bilateral: Secondary | ICD-10-CM | POA: Diagnosis not present

## 2019-04-05 DIAGNOSIS — H524 Presbyopia: Secondary | ICD-10-CM | POA: Diagnosis not present

## 2019-04-05 DIAGNOSIS — H25813 Combined forms of age-related cataract, bilateral: Secondary | ICD-10-CM | POA: Diagnosis not present

## 2019-04-05 DIAGNOSIS — H35371 Puckering of macula, right eye: Secondary | ICD-10-CM | POA: Diagnosis not present

## 2019-04-30 DIAGNOSIS — R0982 Postnasal drip: Secondary | ICD-10-CM | POA: Diagnosis not present

## 2019-06-13 ENCOUNTER — Ambulatory Visit: Payer: Medicare Other | Attending: Internal Medicine

## 2019-06-13 DIAGNOSIS — Z23 Encounter for immunization: Secondary | ICD-10-CM | POA: Diagnosis not present

## 2019-06-13 NOTE — Progress Notes (Signed)
   Covid-19 Vaccination Clinic  Name:  Eric Bradley    MRN: [id number] DOB: 07-Nov-1945  06/13/2019  Eric Bradley was observed post Covid-19 immunization for 15 minutes without incidence. He was provided with Vaccine Information Sheet and instruction to access the V-Safe system.   Eric Bradley was instructed to call 911 with any severe reactions post vaccine: Marland Kitchen Difficulty breathing  . Swelling of your face and throat  . A fast heartbeat  . A bad rash all over your body  . Dizziness and weakness    Immunizations Administered    Name Date Dose VIS Date Route   Pfizer COVID-19 Vaccine 06/13/2019  8:44 AM 0.3 mL 05/04/2019 Intramuscular   Manufacturer: [place]   Lot: [id number]   [place]: [id number]

## 2019-07-01 ENCOUNTER — Ambulatory Visit: Payer: Medicare Other | Attending: Internal Medicine

## 2019-07-01 DIAGNOSIS — Z23 Encounter for immunization: Secondary | ICD-10-CM

## 2019-07-01 NOTE — Progress Notes (Signed)
   Covid-19 Vaccination Clinic  Name:  Eric Bradley    MRN: [id number] DOB: 1946-02-02  07/01/2019  [name] was observed post Covid-19 immunization for 15 minutes without incidence. He was provided with Vaccine Information Sheet and instruction to access the V-Safe system.   [name] was instructed to call 911 with any severe reactions post vaccine: Marland Kitchen Difficulty breathing  . Swelling of your face and throat  . A fast heartbeat  . A bad rash all over your body  . Dizziness and weakness    Immunizations Administered    Name Date Dose VIS Date Route   Pfizer COVID-19 Vaccine 07/01/2019 11:47 AM 0.3 mL 05/04/2019 Intramuscular   Manufacturer: [place]   Lot: [id number]   [place]: [id number]

## 2019-09-17 DIAGNOSIS — Q211 Atrial septal defect: Secondary | ICD-10-CM | POA: Diagnosis not present

## 2019-09-17 DIAGNOSIS — I2510 Atherosclerotic heart disease of native coronary artery without angina pectoris: Secondary | ICD-10-CM | POA: Diagnosis not present

## 2019-09-17 DIAGNOSIS — Z0000 Encounter for general adult medical examination without abnormal findings: Secondary | ICD-10-CM | POA: Diagnosis not present

## 2019-09-17 DIAGNOSIS — Z8673 Personal history of transient ischemic attack (TIA), and cerebral infarction without residual deficits: Secondary | ICD-10-CM | POA: Diagnosis not present

## 2019-09-17 DIAGNOSIS — Z125 Encounter for screening for malignant neoplasm of prostate: Secondary | ICD-10-CM | POA: Diagnosis not present

## 2019-09-17 DIAGNOSIS — Z1389 Encounter for screening for other disorder: Secondary | ICD-10-CM | POA: Diagnosis not present

## 2019-09-17 DIAGNOSIS — E7800 Pure hypercholesterolemia, unspecified: Secondary | ICD-10-CM | POA: Diagnosis not present

## 2019-09-17 DIAGNOSIS — K219 Gastro-esophageal reflux disease without esophagitis: Secondary | ICD-10-CM | POA: Diagnosis not present

## 2020-02-27 DIAGNOSIS — Z23 Encounter for immunization: Secondary | ICD-10-CM | POA: Diagnosis not present

## 2020-02-29 DIAGNOSIS — M1612 Unilateral primary osteoarthritis, left hip: Secondary | ICD-10-CM | POA: Diagnosis not present

## 2020-03-05 DIAGNOSIS — M25552 Pain in left hip: Secondary | ICD-10-CM | POA: Diagnosis not present

## 2020-03-05 DIAGNOSIS — M1612 Unilateral primary osteoarthritis, left hip: Secondary | ICD-10-CM | POA: Diagnosis not present

## 2020-03-16 DIAGNOSIS — Z23 Encounter for immunization: Secondary | ICD-10-CM | POA: Diagnosis not present

## 2020-03-25 DIAGNOSIS — M1612 Unilateral primary osteoarthritis, left hip: Secondary | ICD-10-CM | POA: Diagnosis not present

## 2020-04-02 DIAGNOSIS — I63439 Cerebral infarction due to embolism of unspecified posterior cerebral artery: Secondary | ICD-10-CM | POA: Diagnosis not present

## 2020-04-02 DIAGNOSIS — I2510 Atherosclerotic heart disease of native coronary artery without angina pectoris: Secondary | ICD-10-CM | POA: Diagnosis not present

## 2020-04-02 DIAGNOSIS — Q211 Atrial septal defect: Secondary | ICD-10-CM | POA: Diagnosis not present

## 2020-04-02 DIAGNOSIS — I350 Nonrheumatic aortic (valve) stenosis: Secondary | ICD-10-CM | POA: Diagnosis not present

## 2020-04-09 ENCOUNTER — Other Ambulatory Visit: Payer: Self-pay | Admitting: Orthopaedic Surgery

## 2020-04-14 ENCOUNTER — Telehealth: Payer: Self-pay | Admitting: Cardiology

## 2020-04-14 NOTE — Telephone Encounter (Signed)
Pre-op covering staff, it does not look like patient follows with [place]. Looks like she follows with Dr. Corine Shelter at [facility]. Can you please notify requesting surgeon's office? I will remove from pre-op pool.  Thank you!

## 2020-04-14 NOTE — Telephone Encounter (Signed)
Follow Up:     Eric Bradley is calling to check on the status of pt's clearance. Please fax to [phone].

## 2020-04-16 ENCOUNTER — Telehealth: Payer: Self-pay

## 2020-04-16 NOTE — Telephone Encounter (Signed)
Left Message on Carey Bullocks VM informing to re route cardiac clearance to Dr. Corine Shelter at [facility] per pre op team. [phone]

## 2020-04-25 NOTE — Patient Instructions (Addendum)
DUE TO COVID-19 ONLY ONE VISITOR IS ALLOWED TO COME WITH YOU AND STAY IN THE WAITING ROOM ONLY DURING PRE OP AND PROCEDURE DAY OF SURGERY. THE 1 VISITOR  MAY VISIT WITH YOU AFTER SURGERY IN YOUR PRIVATE ROOM DURING VISITING HOURS ONLY!  YOU NEED TO HAVE A COVID 19 TEST ON: 05/02/20 @ 10:00 AM , THIS TEST MUST BE DONE BEFORE SURGERY,  COVID TESTING SITE [place] [address], IT IS ON THE RIGHT GOING OUT [address] APPROXIMATELY  2 MINUTES PAST [employer] ON THE RIGHT. ONCE YOUR COVID TEST IS COMPLETED,  PLEASE BEGIN THE QUARANTINE INSTRUCTIONS AS OUTLINED IN YOUR HANDOUT.                Audel Coakley Stys    Your procedure is scheduled on: 05/06/20   Report to [facility] Main  Entrance   Report to admitting at: 10:15 AM     Call this number if you have problems the morning of surgery [phone]    Remember:   NO SOLID FOOD AFTER MIDNIGHT THE NIGHT PRIOR TO SURGERY. NOTHING BY MOUTH EXCEPT CLEAR LIQUIDS UNTIL: 9:45 AM. PLEASE FINISH ENSURE DRINK PER SURGEON ORDER  WHICH NEEDS TO BE COMPLETED AT: 9:45 AM .  CLEAR LIQUID DIET   Foods Allowed                                                                     Foods Excluded  Coffee and tea, regular and decaf                             liquids that you cannot  Plain Jell-O any favor except red or purple                                           see through such as: Fruit ices (not with fruit pulp)                                     milk, soups, orange juice  Iced Popsicles                                    All solid food Carbonated beverages, regular and diet                                    Cranberry, grape and apple juices Sports drinks like Gatorade Lightly seasoned clear broth or consume(fat free) Sugar, honey syrup  Sample Menu Breakfast                                Lunch  Supper Cranberry juice                    Beef broth                             Chicken broth Jell-O                                     Grape juice                           Apple juice Coffee or tea                        Jell-O                                      Popsicle                                                Coffee or tea                        Coffee or tea  _____________________________________________________________________  BRUSH YOUR TEETH MORNING OF SURGERY AND RINSE YOUR MOUTH OUT, NO CHEWING GUM CANDY OR MINTS.    Take these medicines the morning of surgery with A SIP OF WATER: pantoprazole.                               You may not have any metal on your body including hair pins and              piercings  Do not wear jewelry, lotions, powders or perfumes, deodorant             Men may shave face and neck.   Do not bring valuables to the hospital. [place].  Contacts, dentures or bridgework may not be worn into surgery.  Leave suitcase in the car. After surgery it may be brought to your room.     Patients discharged the day of surgery will not be allowed to drive home. IF YOU ARE HAVING SURGERY AND GOING HOME THE SAME DAY, YOU MUST HAVE AN ADULT TO DRIVE YOU HOME AND BE WITH YOU FOR 24 HOURS. YOU MAY GO HOME BY TAXI OR UBER OR ORTHERWISE, BUT AN ADULT MUST ACCOMPANY YOU HOME AND STAY WITH YOU FOR 24 HOURS.  Name and phone number of your driver:  Special Instructions: N/A              Please read over the following fact sheets you were given: _____________________________________________________________________         [facility] - Preparing for Surgery Before surgery, you can play an important role.  Because skin is not sterile, your skin needs to be as free of germs as possible.  You can reduce the number of germs on your skin by washing with CHG (chlorahexidine gluconate) soap  before surgery.  CHG is an antiseptic cleaner which kills germs and bonds with the skin to continue killing  germs even after washing. Please DO NOT use if you have an allergy to CHG or antibacterial soaps.  If your skin becomes reddened/irritated stop using the CHG and inform your nurse when you arrive at Short Stay. Do not shave (including legs and underarms) for at least 48 hours prior to the first CHG shower.  You may shave your face/neck. Please follow these instructions carefully:  1.  Shower with CHG Soap the night before surgery and the  morning of Surgery.  2.  If you choose to wash your hair, wash your hair first as usual with your  normal  shampoo.  3.  After you shampoo, rinse your hair and body thoroughly to remove the  shampoo.                           4.  Use CHG as you would any other liquid soap.  You can apply chg directly  to the skin and wash                       Gently with a scrungie or clean washcloth.  5.  Apply the CHG Soap to your body ONLY FROM THE NECK DOWN.   Do not use on face/ open                           Wound or open sores. Avoid contact with eyes, ears mouth and genitals (private parts).                       Wash face,  Genitals (private parts) with your normal soap.             6.  Wash thoroughly, paying special attention to the area where your surgery  will be performed.  7.  Thoroughly rinse your body with warm water from the neck down.  8.  DO NOT shower/wash with your normal soap after using and rinsing off  the CHG Soap.                9.  Pat yourself dry with a clean towel.            10.  Wear clean pajamas.            11.  Place clean sheets on your bed the night of your first shower and do not  sleep with pets. Day of Surgery : Do not apply any lotions/deodorants the morning of surgery.  Please wear clean clothes to the hospital/surgery center.  FAILURE TO FOLLOW THESE INSTRUCTIONS MAY RESULT IN THE CANCELLATION OF YOUR SURGERY PATIENT SIGNATURE_________________________________  NURSE  SIGNATURE__________________________________  ________________________________________________________________________   Adam Phenix  An incentive spirometer is a tool that can help keep your lungs clear and active. This tool measures how well you are filling your lungs with each breath. Taking long deep breaths may help reverse or decrease the chance of developing breathing (pulmonary) problems (especially infection) following:  A long period of time when you are unable to move or be active. BEFORE THE PROCEDURE   If the spirometer includes an indicator to show your best effort, your nurse or respiratory therapist will set it to a desired goal.  If possible, sit up straight or lean slightly forward. Try  not to slouch.  Hold the incentive spirometer in an upright position. INSTRUCTIONS FOR USE  1. Sit on the edge of your bed if possible, or sit up as far as you can in bed or on a chair. 2. Hold the incentive spirometer in an upright position. 3. Breathe out normally. 4. Place the mouthpiece in your mouth and seal your lips tightly around it. 5. Breathe in slowly and as deeply as possible, raising the piston or the ball toward the top of the column. 6. Hold your breath for 3-5 seconds or for as long as possible. Allow the piston or ball to fall to the bottom of the column. 7. Remove the mouthpiece from your mouth and breathe out normally. 8. Rest for a few seconds and repeat Steps 1 through 7 at least 10 times every 1-2 hours when you are awake. Take your time and take a few normal breaths between deep breaths. 9. The spirometer may include an indicator to show your best effort. Use the indicator as a goal to work toward during each repetition. 10. After each set of 10 deep breaths, practice coughing to be sure your lungs are clear. If you have an incision (the cut made at the time of surgery), support your incision when coughing by placing a pillow or rolled up towels firmly  against it. Once you are able to get out of bed, walk around indoors and cough well. You may stop using the incentive spirometer when instructed by your caregiver.  RISKS AND COMPLICATIONS  Take your time so you do not get dizzy or light-headed.  If you are in pain, you may need to take or ask for pain medication before doing incentive spirometry. It is harder to take a deep breath if you are having pain. AFTER USE  Rest and breathe slowly and easily.  It can be helpful to keep track of a log of your progress. Your caregiver can provide you with a simple table to help with this. If you are using the spirometer at home, follow these instructions: [place] IF:   You are having difficultly using the spirometer.  You have trouble using the spirometer as often as instructed.  Your pain medication is not giving enough relief while using the spirometer.  You develop fever of 100.5 F (38.1 C) or higher. SEEK IMMEDIATE MEDICAL CARE IF:   You cough up bloody sputum that had not been present before.  You develop fever of 102 F (38.9 C) or greater.  You develop worsening pain at or near the incision site. MAKE SURE YOU:   Understand these instructions.  Will watch your condition.  Will get help right away if you are not doing well or get worse. Document Released: 09/20/2006 Document Revised: 08/02/2011 Document Reviewed: 11/21/2006 [facility] Patient Information 2014 [place], [place].   ________________________________________________________________________

## 2020-04-28 NOTE — Care Plan (Signed)
Ortho Bundle Case Management Note  Patient Details  Name: Eric Bradley MRN: [id number] Date of Birth: November 30, 1945     Spoke with patient prior to surgery. He will discharge to home with family to assist. Rolling walker ordered from [employer]. OPPT set up with [place]. Patient and MD in agreement with plan. Choice offered                DME Arranged:  Walker rolling DME Agency:  [employer]  HH Arranged:    [place] Agency:     Additional Comments: Please contact me with any questions of if this plan should need to change.  Ladell Heads,  [place] Orthopaedic Specialist  [phone] 04/28/2020, 2:36 PM

## 2020-04-29 ENCOUNTER — Encounter (HOSPITAL_COMMUNITY)
Admission: RE | Admit: 2020-04-29 | Discharge: 2020-04-29 | Disposition: A | Discharge status: Home / Self Care | Payer: Medicare Other | Source: Ambulatory Visit | Attending: Orthopaedic Surgery | Admitting: Orthopaedic Surgery

## 2020-04-29 ENCOUNTER — Other Ambulatory Visit: Payer: Self-pay

## 2020-04-29 ENCOUNTER — Encounter (HOSPITAL_COMMUNITY): Payer: Self-pay

## 2020-04-29 ENCOUNTER — Ambulatory Visit (HOSPITAL_COMMUNITY)
Admission: RE | Admit: 2020-04-29 | Discharge: 2020-04-29 | Disposition: A | Discharge status: Home / Self Care | Payer: Medicare Other | Source: Ambulatory Visit | Attending: Orthopaedic Surgery | Admitting: Orthopaedic Surgery

## 2020-04-29 DIAGNOSIS — Z01818 Encounter for other preprocedural examination: Secondary | ICD-10-CM

## 2020-04-29 DIAGNOSIS — Z8673 Personal history of transient ischemic attack (TIA), and cerebral infarction without residual deficits: Secondary | ICD-10-CM | POA: Insufficient documentation

## 2020-04-29 DIAGNOSIS — Z87891 Personal history of nicotine dependence: Secondary | ICD-10-CM | POA: Diagnosis not present

## 2020-04-29 DIAGNOSIS — Z955 Presence of coronary angioplasty implant and graft: Secondary | ICD-10-CM | POA: Insufficient documentation

## 2020-04-29 HISTORY — DX: Cerebral infarction, unspecified: I63.9

## 2020-04-29 HISTORY — DX: Atherosclerotic heart disease of native coronary artery without angina pectoris: I25.10

## 2020-04-29 HISTORY — DX: Cardiac murmur, unspecified: R01.1

## 2020-04-29 LAB — URINALYSIS, ROUTINE W REFLEX MICROSCOPIC
Bilirubin Urine: NEGATIVE
Glucose, UA: NEGATIVE mg/dL
Hgb urine dipstick: NEGATIVE
Ketones, ur: NEGATIVE mg/dL
Leukocytes,Ua: NEGATIVE
Nitrite: NEGATIVE
Protein, ur: NEGATIVE mg/dL
Specific Gravity, Urine: 1.019 (ref 1.005–1.030)
pH: 6 (ref 5.0–8.0)

## 2020-04-29 LAB — BASIC METABOLIC PANEL
Anion gap: 9 (ref 5–15)
BUN: 32 mg/dL — ABNORMAL HIGH (ref 8–23)
CO2: 26 mmol/L (ref 22–32)
Calcium: 9.3 mg/dL (ref 8.9–10.3)
Chloride: 104 mmol/L (ref 98–111)
Creatinine, Ser: 1.29 mg/dL — ABNORMAL HIGH (ref 0.61–1.24)
GFR, Estimated: 58 mL/min — ABNORMAL LOW (ref >60)
Glucose, Bld: 97 mg/dL (ref 70–99)
Potassium: 4.8 mmol/L (ref 3.5–5.1)
Sodium: 139 mmol/L (ref 135–145)

## 2020-04-29 LAB — CBC WITH DIFFERENTIAL/PLATELET
Abs Immature Granulocytes: 0.02 10*3/uL (ref 0.00–0.07)
Basophils Absolute: 0.1 10*3/uL (ref 0.0–0.1)
Basophils Relative: 1 %
Eosinophils Absolute: 0.1 10*3/uL (ref 0.0–0.5)
Eosinophils Relative: 2 %
HCT: 44.3 % (ref 39.0–52.0)
Hemoglobin: 14.6 g/dL (ref 13.0–17.0)
Immature Granulocytes: 0 %
Lymphocytes Relative: 25 %
Lymphs Abs: 1.7 10*3/uL (ref 0.7–4.0)
MCH: 27.6 pg (ref 26.0–34.0)
MCHC: 33 g/dL (ref 30.0–36.0)
MCV: 83.7 fL (ref 80.0–100.0)
Monocytes Absolute: 0.5 10*3/uL (ref 0.1–1.0)
Monocytes Relative: 7 %
Neutro Abs: 4.2 10*3/uL (ref 1.7–7.7)
Neutrophils Relative %: 65 %
Platelets: 273 10*3/uL (ref 150–400)
RBC: 5.29 MIL/uL (ref 4.22–5.81)
RDW: 12.3 % (ref 11.5–15.5)
WBC: 6.6 10*3/uL (ref 4.0–10.5)
nRBC: 0 % (ref 0.0–0.2)

## 2020-04-29 LAB — SURGICAL PCR SCREEN
MRSA, PCR: NEGATIVE
Staphylococcus aureus: NEGATIVE

## 2020-04-29 LAB — PROTIME-INR
INR: 1 (ref 0.8–1.2)
Prothrombin Time: 12.6 seconds (ref 11.4–15.2)

## 2020-04-29 LAB — APTT: aPTT: 29 seconds (ref 24–36)

## 2020-04-29 NOTE — Progress Notes (Signed)
COVID Vaccine Completed: Yes Date COVID Vaccine completed: 03/16/20. [place] COVID vaccine manufacturer: Pfizer    PCP -  Dr. Lavone Orn Cardiologist - Dr. Eustaquio Maize. Clearance: 04/02/20: EPIC  Chest x-ray -  EKG -  Stress Test -  ECHO -  Cardiac Cath -  Pacemaker/ICD device last checked:  Sleep Study -  CPAP -   Fasting Blood Sugar -  Checks Blood Sugar _____ times a day  Blood Thinner Instructions: Plavix can be hold seven days before as per cardiologist. Aspirin Instructions: Last Dose:  Anesthesia review: Hx: HTN,TIA's,Stroke.  Patient denies shortness of breath, fever, cough and chest pain at PAT appointment   Patient verbalized understanding of instructions that were given to them at the PAT appointment. Patient was also instructed that they will need to review over the PAT instructions again at home before surgery.
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
## 2020-05-02 ENCOUNTER — Other Ambulatory Visit (HOSPITAL_COMMUNITY)
Admission: RE | Admit: 2020-05-02 | Discharge: 2020-05-02 | Disposition: A | Discharge status: Home / Self Care | Payer: Medicare Other | Source: Ambulatory Visit | Attending: Orthopaedic Surgery | Admitting: Orthopaedic Surgery

## 2020-05-02 DIAGNOSIS — Z20822 Contact with and (suspected) exposure to covid-19: Secondary | ICD-10-CM | POA: Insufficient documentation

## 2020-05-02 DIAGNOSIS — Z01812 Encounter for preprocedural laboratory examination: Secondary | ICD-10-CM | POA: Diagnosis not present

## 2020-05-02 LAB — SARS CORONAVIRUS 2 (TAT 6-24 HRS): SARS Coronavirus 2: NEGATIVE

## 2020-05-05 MED ORDER — TRANEXAMIC ACID 1000 MG/10ML IV SOLN
2000.0000 mg | INTRAVENOUS | Status: DC
  Filled 2020-05-05: qty 20

## 2020-05-05 MED ORDER — BUPIVACAINE LIPOSOME 1.3 % IJ SUSP
10.0000 mL | Freq: Once | INTRAMUSCULAR | Status: DC
  Filled 2020-05-05: qty 10
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
## 2020-05-06 ENCOUNTER — Encounter (HOSPITAL_COMMUNITY)
Admission: RE | Disposition: A | Discharge status: Home / Self Care | Payer: Self-pay | Source: Home / Self Care | Attending: Orthopaedic Surgery

## 2020-05-06 ENCOUNTER — Ambulatory Visit (HOSPITAL_COMMUNITY): Payer: Medicare Other

## 2020-05-06 ENCOUNTER — Ambulatory Visit (HOSPITAL_COMMUNITY): Payer: Medicare Other | Admitting: Certified Registered Nurse Anesthetist

## 2020-05-06 ENCOUNTER — Ambulatory Visit (HOSPITAL_COMMUNITY)
Admission: RE | Admit: 2020-05-06 | Discharge: 2020-05-06 | Disposition: A | Discharge status: Home / Self Care | Payer: Medicare Other | Attending: Orthopaedic Surgery | Admitting: Orthopaedic Surgery

## 2020-05-06 ENCOUNTER — Ambulatory Visit (HOSPITAL_COMMUNITY): Payer: Medicare Other | Admitting: Physician Assistant

## 2020-05-06 ENCOUNTER — Encounter (HOSPITAL_COMMUNITY): Payer: Self-pay | Admitting: Orthopaedic Surgery

## 2020-05-06 DIAGNOSIS — Z87891 Personal history of nicotine dependence: Secondary | ICD-10-CM | POA: Insufficient documentation

## 2020-05-06 DIAGNOSIS — Z419 Encounter for procedure for purposes other than remedying health state, unspecified: Secondary | ICD-10-CM

## 2020-05-06 DIAGNOSIS — Z471 Aftercare following joint replacement surgery: Secondary | ICD-10-CM | POA: Diagnosis not present

## 2020-05-06 DIAGNOSIS — I2510 Atherosclerotic heart disease of native coronary artery without angina pectoris: Secondary | ICD-10-CM | POA: Diagnosis not present

## 2020-05-06 DIAGNOSIS — Z79899 Other long term (current) drug therapy: Secondary | ICD-10-CM | POA: Diagnosis not present

## 2020-05-06 DIAGNOSIS — Z7902 Long term (current) use of antithrombotics/antiplatelets: Secondary | ICD-10-CM | POA: Diagnosis not present

## 2020-05-06 DIAGNOSIS — I639 Cerebral infarction, unspecified: Secondary | ICD-10-CM | POA: Diagnosis not present

## 2020-05-06 DIAGNOSIS — M1612 Unilateral primary osteoarthritis, left hip: Secondary | ICD-10-CM | POA: Insufficient documentation

## 2020-05-06 DIAGNOSIS — K219 Gastro-esophageal reflux disease without esophagitis: Secondary | ICD-10-CM | POA: Diagnosis not present

## 2020-05-06 DIAGNOSIS — Z96642 Presence of left artificial hip joint: Secondary | ICD-10-CM | POA: Diagnosis not present

## 2020-05-06 HISTORY — PX: TOTAL HIP ARTHROPLASTY: SHX124

## 2020-05-06 LAB — ABO/RH: ABO/RH(D): A POS

## 2020-05-06 LAB — TYPE AND SCREEN
ABO/RH(D): A POS
Antibody Screen: NEGATIVE

## 2020-05-06 SURGERY — ARTHROPLASTY, HIP, TOTAL, ANTERIOR APPROACH
Anesthesia: Spinal | Site: Hip | Laterality: Left

## 2020-05-06 MED ORDER — BUPIVACAINE LIPOSOME 1.3 % IJ SUSP
INTRAMUSCULAR | Status: DC | PRN
  Administered 2020-05-06: 10 mL

## 2020-05-06 MED ORDER — LACTATED RINGERS IV BOLUS
250.0000 mL | Freq: Once | INTRAVENOUS | Status: AC
  Administered 2020-05-06: 17:00:00 250 mL via INTRAVENOUS

## 2020-05-06 MED ORDER — CHLORHEXIDINE GLUCONATE 0.12 % MT SOLN
15.0000 mL | Freq: Once | OROMUCOSAL | Status: AC
  Administered 2020-05-06: 11:00:00 15 mL via OROMUCOSAL

## 2020-05-06 MED ORDER — ALBUMIN HUMAN 5 % IV SOLN
INTRAVENOUS | Status: AC
  Filled 2020-05-06: qty 250

## 2020-05-06 MED ORDER — STERILE WATER FOR IRRIGATION IR SOLN
Status: DC | PRN
  Administered 2020-05-06: 1000 mL

## 2020-05-06 MED ORDER — CEFAZOLIN SODIUM-DEXTROSE 2-4 GM/100ML-% IV SOLN: 2.0000 g | Freq: Four times a day (QID) | INTRAVENOUS | Status: DC

## 2020-05-06 MED ORDER — ONDANSETRON HCL 4 MG/2ML IJ SOLN: 4.0000 mg | Freq: Once | INTRAMUSCULAR | Status: DC | PRN

## 2020-05-06 MED ORDER — TRANEXAMIC ACID-NACL 1000-0.7 MG/100ML-% IV SOLN
1000.0000 mg | Freq: Once | INTRAVENOUS | Status: DC
Start: 2020-05-06 — End: 2020-05-07

## 2020-05-06 MED ORDER — LACTATED RINGERS IV SOLN: INTRAVENOUS | Status: DC

## 2020-05-06 MED ORDER — LACTATED RINGERS IV BOLUS
250.0000 mL | Freq: Once | INTRAVENOUS | Status: AC
  Administered 2020-05-06: 18:00:00 250 mL via INTRAVENOUS

## 2020-05-06 MED ORDER — ORAL CARE MOUTH RINSE: 15.0000 mL | Freq: Once | OROMUCOSAL | Status: AC

## 2020-05-06 MED ORDER — ONDANSETRON HCL 4 MG/2ML IJ SOLN
INTRAMUSCULAR | Status: DC | PRN
  Administered 2020-05-06: 4 mg via INTRAVENOUS

## 2020-05-06 MED ORDER — LACTATED RINGERS IV BOLUS
500.0000 mL | Freq: Once | INTRAVENOUS | Status: AC
  Administered 2020-05-06: 16:00:00 500 mL via INTRAVENOUS

## 2020-05-06 MED ORDER — POVIDONE-IODINE 10 % EX SWAB
2.0000 "application " | Freq: Once | CUTANEOUS | Status: AC
  Administered 2020-05-06: 2 via TOPICAL

## 2020-05-06 MED ORDER — DEXAMETHASONE SODIUM PHOSPHATE 10 MG/ML IJ SOLN
INTRAMUSCULAR | Status: AC
  Filled 2020-05-06: qty 1

## 2020-05-06 MED ORDER — BUPIVACAINE-EPINEPHRINE (PF) 0.25% -1:200000 IJ SOLN
INTRAMUSCULAR | Status: AC
  Filled 2020-05-06: qty 30

## 2020-05-06 MED ORDER — FENTANYL CITRATE (PF) 100 MCG/2ML IJ SOLN
INTRAMUSCULAR | Status: DC | PRN
  Administered 2020-05-06: 100 ug via INTRAVENOUS

## 2020-05-06 MED ORDER — PROPOFOL 10 MG/ML IV BOLUS
INTRAVENOUS | Status: DC | PRN
  Administered 2020-05-06: 20 mg via INTRAVENOUS
  Administered 2020-05-06: 30 mg via INTRAVENOUS

## 2020-05-06 MED ORDER — LACTATED RINGERS IV SOLN: INTRAVENOUS | Status: DC | PRN

## 2020-05-06 MED ORDER — PROPOFOL 10 MG/ML IV BOLUS
INTRAVENOUS | Status: AC
  Filled 2020-05-06: qty 20

## 2020-05-06 MED ORDER — FENTANYL CITRATE (PF) 100 MCG/2ML IJ SOLN
25.0000 ug | INTRAMUSCULAR | Status: DC | PRN
Start: 2020-05-06 — End: 2020-05-07

## 2020-05-06 MED ORDER — HYDROCODONE-ACETAMINOPHEN 5-325 MG PO TABS
1.0000 | ORAL_TABLET | ORAL | Status: DC | PRN
  Administered 2020-05-06: 1 via ORAL

## 2020-05-06 MED ORDER — HYDROCODONE-ACETAMINOPHEN 5-325 MG PO TABS
ORAL_TABLET | ORAL | Status: AC
  Filled 2020-05-06: qty 1

## 2020-05-06 MED ORDER — CEFAZOLIN SODIUM-DEXTROSE 2-4 GM/100ML-% IV SOLN
2.0000 g | INTRAVENOUS | Status: AC
  Administered 2020-05-06: 14:00:00 2 g via INTRAVENOUS
  Filled 2020-05-06: qty 100

## 2020-05-06 MED ORDER — ONDANSETRON HCL 4 MG/2ML IJ SOLN
INTRAMUSCULAR | Status: AC
  Filled 2020-05-06: qty 2

## 2020-05-06 MED ORDER — BUPIVACAINE-EPINEPHRINE (PF) 0.25% -1:200000 IJ SOLN
INTRAMUSCULAR | Status: DC | PRN
  Administered 2020-05-06: 30 mL via PERINEURAL

## 2020-05-06 MED ORDER — TRANEXAMIC ACID-NACL 1000-0.7 MG/100ML-% IV SOLN
1000.0000 mg | INTRAVENOUS | Status: AC
  Administered 2020-05-06: 14:00:00 1000 mg via INTRAVENOUS
  Filled 2020-05-06: qty 100

## 2020-05-06 MED ORDER — ASPIRIN EC 81 MG PO TBEC: 81.0000 mg | DELAYED_RELEASE_TABLET | Freq: Two times a day (BID) | ORAL | 0 refills | Status: AC

## 2020-05-06 MED ORDER — TRANEXAMIC ACID 1000 MG/10ML IV SOLN
INTRAVENOUS | Status: DC | PRN
  Administered 2020-05-06: 15:00:00 2000 mg via TOPICAL

## 2020-05-06 MED ORDER — 0.9 % SODIUM CHLORIDE (POUR BTL) OPTIME
TOPICAL | Status: DC | PRN
  Administered 2020-05-06: 15:00:00 1000 mL

## 2020-05-06 MED ORDER — TIZANIDINE HCL 4 MG PO TABS: 4.0000 mg | ORAL_TABLET | Freq: Four times a day (QID) | ORAL | 1 refills | Status: AC | PRN

## 2020-05-06 MED ORDER — HYDROCODONE-ACETAMINOPHEN 5-325 MG PO TABS: 1.0000 | ORAL_TABLET | Freq: Four times a day (QID) | ORAL | 0 refills | Status: AC | PRN

## 2020-05-06 MED ORDER — BUPIVACAINE IN DEXTROSE 0.75-8.25 % IT SOLN
INTRATHECAL | Status: DC | PRN
  Administered 2020-05-06: 1.8 mL via INTRATHECAL

## 2020-05-06 MED ORDER — ALBUMIN HUMAN 5 % IV SOLN: INTRAVENOUS | Status: DC | PRN

## 2020-05-06 MED ORDER — PROPOFOL 500 MG/50ML IV EMUL
INTRAVENOUS | Status: DC | PRN
  Administered 2020-05-06: 100 ug/kg/min via INTRAVENOUS

## 2020-05-06 MED ORDER — DEXAMETHASONE SODIUM PHOSPHATE 10 MG/ML IJ SOLN
INTRAMUSCULAR | Status: DC | PRN
  Administered 2020-05-06: 4 mg via INTRAVENOUS

## 2020-05-06 MED ORDER — LIDOCAINE HCL (CARDIAC) PF 100 MG/5ML IV SOSY
PREFILLED_SYRINGE | INTRAVENOUS | Status: DC | PRN
  Administered 2020-05-06: 60 mg via INTRAVENOUS

## 2020-05-06 MED ORDER — PHENYLEPHRINE HCL-NACL 10-0.9 MG/250ML-% IV SOLN
INTRAVENOUS | Status: DC | PRN
  Administered 2020-05-06: 50 ug/min via INTRAVENOUS

## 2020-05-06 MED ORDER — FENTANYL CITRATE (PF) 100 MCG/2ML IJ SOLN
INTRAMUSCULAR | Status: AC
  Filled 2020-05-06: qty 2

## 2020-05-06 MED ORDER — LIDOCAINE HCL (PF) 2 % IJ SOLN
INTRAMUSCULAR | Status: AC
  Filled 2020-05-06: qty 5

## 2020-05-06 SURGICAL SUPPLY — 45 items
BAG DECANTER FOR FLEXI CONT (MISCELLANEOUS) ×3 IMPLANT
BLADE SAW SGTL 18X1.27X75 (BLADE) ×2 IMPLANT
BLADE SAW SGTL 18X1.27X75MM (BLADE) ×1
BOOTIES KNEE HIGH SLOAN (MISCELLANEOUS) ×3 IMPLANT
CELLS DAT CNTRL 66122 CELL SVR (MISCELLANEOUS) ×1 IMPLANT
COVER PERINEAL POST (MISCELLANEOUS) ×3 IMPLANT
COVER SURGICAL LIGHT HANDLE (MISCELLANEOUS) ×3 IMPLANT
COVER WAND RF STERILE (DRAPES) IMPLANT
CUP ACET GRIPTION SERIS 56 100 (Trauma) ×1 IMPLANT
DECANTER SPIKE VIAL GLASS SM (MISCELLANEOUS) ×3 IMPLANT
DRAPE IMP U-DRAPE 54X76 (DRAPES) ×3 IMPLANT
DRAPE ORTHO SPLIT 77X108 STRL (DRAPES)
DRAPE STERI IOBAN 125X83 (DRAPES) ×3 IMPLANT
DRAPE SURG ORHT 6 SPLT 77X108 (DRAPES) IMPLANT
DRAPE U-SHAPE 47X51 STRL (DRAPES) ×6 IMPLANT
DRSG AQUACEL AG ADV 3.5X 6 (GAUZE/BANDAGES/DRESSINGS) ×3 IMPLANT
DURAPREP 26ML APPLICATOR (WOUND CARE) ×3 IMPLANT
ELECT BLADE TIP CTD 4 INCH (ELECTRODE) ×3 IMPLANT
ELECT REM PT RETURN 15FT ADLT (MISCELLANEOUS) ×3 IMPLANT
ELIMINATOR HOLE APEX DEPUY (Hips) ×3 IMPLANT
GIPTION SERIES 56 100 (Trauma) ×3 IMPLANT
GLOVE BIO SURGEON STRL SZ8 (GLOVE) ×6 IMPLANT
GLOVE BIOGEL PI IND STRL 8 (GLOVE) ×2 IMPLANT
GLOVE BIOGEL PI INDICATOR 8 (GLOVE) ×4
GOWN STRL REUS W/TWL XL LVL3 (GOWN DISPOSABLE) ×6 IMPLANT
HEAD CERAMIC DELTA 36 PLUS 1.5 (Hips) ×3 IMPLANT
HOLDER FOLEY CATH W/STRAP (MISCELLANEOUS) ×3 IMPLANT
KIT TURNOVER KIT A (KITS) IMPLANT
MANIFOLD NEPTUNE II (INSTRUMENTS) ×3 IMPLANT
NEEDLE HYPO 22GX1.5 SAFETY (NEEDLE) ×3 IMPLANT
NS IRRIG 1000ML POUR BTL (IV SOLUTION) ×3 IMPLANT
PACK ANTERIOR HIP CUSTOM (KITS) ×3 IMPLANT
PENCIL SMOKE EVACUATOR (MISCELLANEOUS) IMPLANT
PINNACLE ALTRX PLUS 4 N 36X56 (Hips) ×3 IMPLANT
PROTECTOR NERVE ULNAR (MISCELLANEOUS) ×3 IMPLANT
RTRCTR WOUND ALEXIS 18CM MED (MISCELLANEOUS) ×3
STEM FEMORAL SZ6 HIGH ACTIS (Stem) ×3 IMPLANT
SUT ETHIBOND NAB CT1 #1 30IN (SUTURE) ×6 IMPLANT
SUT VIC AB 1 CT1 36 (SUTURE) ×3 IMPLANT
SUT VIC AB 2-0 CT1 27 (SUTURE) ×3
SUT VIC AB 2-0 CT1 TAPERPNT 27 (SUTURE) ×1 IMPLANT
SUT VICRYL AB 3-0 FS1 BRD 27IN (SUTURE) ×3 IMPLANT
SUT VLOC 180 0 24IN GS25 (SUTURE) ×3 IMPLANT
SYR 50ML LL SCALE MARK (SYRINGE) ×3 IMPLANT
TRAY FOLEY MTR SLVR 16FR STAT (SET/KITS/TRAYS/PACK) ×3 IMPLANT

## 2020-05-06 NOTE — Anesthesia Postprocedure Evaluation (Signed)
Anesthesia Post Note  Patient: Eric Bradley  Procedure(s) Performed: LEFT TOTAL HIP ARTHROPLASTY ANTERIOR APPROACH (Left Hip)     Patient location during evaluation: PACU Anesthesia Type: Spinal Level of consciousness: oriented and awake and alert Pain management: pain level controlled Vital Signs Assessment: post-procedure vital signs reviewed and stable Respiratory status: spontaneous breathing, respiratory function stable and nonlabored ventilation Cardiovascular status: blood pressure returned to baseline and stable Postop Assessment: no headache, no backache, no apparent nausea or vomiting and spinal receding Anesthetic complications: no   No complications documented.  Last Vitals:  Vitals:   05/06/20 1615 05/06/20 1630  BP: 138/71 136/75  Pulse: 71 76  Resp: 14 16  Temp:    SpO2: 95% 96%    Last Pain:  Vitals:   05/06/20 1600  TempSrc:   PainSc: 0-No pain                 Sohaib Vereen A.

## 2020-05-06 NOTE — Op Note (Signed)
PRE-OP DIAGNOSIS:  LEFT HIP DEGENERATIVE JOINT DISEASE POST-OP DIAGNOSIS: same PROCEDURE:  LEFT TOTAL HIP ARTHROPLASTY ANTERIOR APPROACH ANESTHESIA:  Spinal and MAC SURGEON:  Melrose Nakayama MD ASSISTANT:  Loni Dolly PA-C   INDICATIONS FOR PROCEDURE:  The patient is a 74 y.o. male with a long history of a painful hip.  This has persisted despite multiple conservative measures.  The patient has persisted with pain and dysfunction making rest and activity difficult.  A total hip replacement is offered as surgical treatment.  Informed operative consent was obtained after discussion of possible complications including reaction to anesthesia, infection, neurovascular injury, dislocation, DVT, PE, and death.  The importance of the postoperative rehab program to optimize result was stressed with the patient.  SUMMARY OF FINDINGS AND PROCEDURE:  Under the above anesthesia through a anterior approach an the Hana table a left THR was performed.  The patient had severe degenerative change and excellent bone quality.  We used DePuy components to replace the hip and these were size 6 high offset Actis femur capped with a +1.5 25m ceramic hip ball.  On the acetabular side we used a size 56 Gription shell with a plus 4 polyethylene liner.  We did use a hole eliminator.  ALoni DollyPA-C assisted throughout and was invaluable to the completion of the case in that he helped position and retract while I performed the procedure.  He also closed simultaneously to help minimize OR time.  I used fluoroscopy throughout the case to check position of components and leg lengths and read all these views myself.  DESCRIPTION OF PROCEDURE:  The patient was taken to the OR suite where the above anesthetic was applied.  The patient was then positioned on the Hana table supine.  All bony prominences were appropriately padded.  Prep and drape was then performed in normal sterile fashion.  The patient was given kefzol preoperative  antibiotic and an appropriate time out was performed.  We then took an anterior approach to the left hip.  Dissection was taken through adipose to the tensor fascia lata fascia.  This structure was incised longitudinally and we dissected in the intermuscular interval just medial to this muscle.  Cobra retractors were placed superior and inferior to the femoral neck superficial to the capsule.  A capsular incision was then made and the retractors were placed along the femoral neck.  Xray was brought in to get a good level for the femoral neck cut which was made with an oscillating saw and osteotome.  The femoral head was removed with a corkscrew.  The acetabulum was exposed and some labral tissues were excised. Reaming was taken to the inside wall of the pelvis and sequentially up to 1 mm smaller than the actual component.  A trial of components was done and then the aforementioned acetabular shell was placed in appropriate tilt and anteversion confirmed by fluoroscopy. The liner was placed along with the hole eliminator and attention was turned to the femur.  The leg was brought down and over into adduction and the elevator bar was used to raise the femur up gently in the wound.  The piriformis was released with care taken to preserve the obturator internus attachment and all of the posterior capsule. The femur was reamed and then broached to the appropriate size.  A trial reduction was done and the aforementioned head and neck assembly gave uKoreathe best stability in extension with external rotation.  Leg lengths were felt to be about equal by  fluoroscopic exam.  The trial components were removed and the wound irrigated.  We then placed the femoral component in appropriate anteversion.  The head was applied to a dry stem neck and the hip again reduced.  It was again stable in the aforementioned position.  The would was irrigated again followed by re-approximation of anterior capsule with ethibond suture. Tensor  fascia was repaired with V-loc suture  followed by deep closure with #O and #2 undyed vicryl.  Skin was closed with subQ stitch and steristrips followed by a sterile dressing.  EBL and IOF can be obtained from anesthesia records.  DISPOSITION:  The patient was extubated in the OR and taken to PACU in stable condition to be admitted to the Orthopedic Surgery for appropriate post-op care to include perioperative antibiotics and DVT prophylaxis.

## 2020-05-06 NOTE — Interval H&P Note (Signed)
History and Physical Interval Note:  05/06/2020 12:23 PM  Eric Bradley  has presented today for surgery, with the diagnosis of LEFT HIP DEGENERATIVE JOINT DISEASE.  The various methods of treatment have been discussed with the patient and family. After consideration of risks, benefits and other options for treatment, the patient has consented to  Procedure(s): LEFT TOTAL HIP ARTHROPLASTY ANTERIOR APPROACH (Left) as a surgical intervention.  The patient's history has been reviewed, patient examined, no change in status, stable for surgery.  I have reviewed the patient's chart and labs.  Questions were answered to the patient's satisfaction.     [name]
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
## 2020-05-06 NOTE — Transfer of Care (Signed)
Immediate Anesthesia Transfer of Care Note  Patient: Shameer Molstad Massoud  Procedure(s) Performed: LEFT TOTAL HIP ARTHROPLASTY ANTERIOR APPROACH (Left Hip)  Patient Location: PACU  Anesthesia Type:Spinal  Level of Consciousness: awake, alert , oriented and patient cooperative  Airway & Oxygen Therapy: Patient Spontanous Breathing and Patient connected to face mask oxygen  Post-op Assessment: Report given to RN and Post -op Vital signs reviewed and stable  Post vital signs: Reviewed and stable  Last Vitals:  Vitals Value Taken Time  BP 136/75 05/06/20 1630  Temp 36.6 C 05/06/20 1630  Pulse 93 05/06/20 1644  Resp 16 05/06/20 1645  SpO2 92 % 05/06/20 1644  Vitals shown include unvalidated device data.  Last Pain:  Vitals:   05/06/20 1630  TempSrc:   PainSc: 0-No pain      Patients Stated Pain Goal: 3 (56/97/94 8016)  Complications: No complications documented.

## 2020-05-06 NOTE — Anesthesia Procedure Notes (Signed)
Spinal ° °Patient location during procedure: OR °End time: 05/06/2020 1:52 PM °Staffing °Performed: resident/CRNA  °Resident/CRNA: Edathil, Roshen T, CRNA °Preanesthetic Checklist °Completed: patient identified, IV checked, site marked, risks and benefits discussed, surgical consent, monitors and equipment checked, pre-op evaluation and timeout performed °Spinal Block °Patient position: sitting °Prep: DuraPrep °Patient monitoring: heart rate, cardiac monitor, continuous pulse ox and blood pressure °Approach: midline °Location: L3-4 °Injection technique: single-shot °Needle °Needle type: Pencan  °Needle gauge: 24 G °Needle length: 9 cm °Assessment °Sensory level: T4 °Additional Notes °IV functioning, monitors applied to pt. Expiration date of kit checked and confirmed to be in date. Sterile prep and drape, hand hygiene and sterile gloved used. Pt was positioned and spine was prepped in sterile fashion. Skin was anesthetized with lidocaine. Free flow of clear CSF obtained prior to injecting local anesthetic into CSF x 1 attempt. Spinal needle aspirated freely following injection. Needle was carefully withdrawn, and pt tolerated procedure well. Loss of motor and sensory on exam post injection.  ° ° ° ° °

## 2020-05-08 ENCOUNTER — Encounter (HOSPITAL_COMMUNITY): Payer: Self-pay | Admitting: Orthopaedic Surgery

## 2020-05-08 DIAGNOSIS — M25652 Stiffness of left hip, not elsewhere classified: Secondary | ICD-10-CM | POA: Diagnosis not present

## 2020-05-08 DIAGNOSIS — M6281 Muscle weakness (generalized): Secondary | ICD-10-CM | POA: Diagnosis not present

## 2020-05-08 DIAGNOSIS — Z96642 Presence of left artificial hip joint: Secondary | ICD-10-CM | POA: Diagnosis not present

## 2020-05-09 DIAGNOSIS — M25652 Stiffness of left hip, not elsewhere classified: Secondary | ICD-10-CM | POA: Diagnosis not present

## 2020-05-09 DIAGNOSIS — M6281 Muscle weakness (generalized): Secondary | ICD-10-CM | POA: Diagnosis not present

## 2020-05-09 DIAGNOSIS — Z96642 Presence of left artificial hip joint: Secondary | ICD-10-CM | POA: Diagnosis not present

## 2020-05-12 DIAGNOSIS — Z96642 Presence of left artificial hip joint: Secondary | ICD-10-CM | POA: Diagnosis not present

## 2020-05-12 DIAGNOSIS — M6281 Muscle weakness (generalized): Secondary | ICD-10-CM | POA: Diagnosis not present

## 2020-05-12 DIAGNOSIS — M25652 Stiffness of left hip, not elsewhere classified: Secondary | ICD-10-CM | POA: Diagnosis not present

## 2020-05-14 DIAGNOSIS — M25652 Stiffness of left hip, not elsewhere classified: Secondary | ICD-10-CM | POA: Diagnosis not present

## 2020-05-14 DIAGNOSIS — Z96642 Presence of left artificial hip joint: Secondary | ICD-10-CM | POA: Diagnosis not present

## 2020-05-14 DIAGNOSIS — M6281 Muscle weakness (generalized): Secondary | ICD-10-CM | POA: Diagnosis not present

## 2020-05-20 DIAGNOSIS — M25652 Stiffness of left hip, not elsewhere classified: Secondary | ICD-10-CM | POA: Diagnosis not present

## 2020-05-20 DIAGNOSIS — M6281 Muscle weakness (generalized): Secondary | ICD-10-CM | POA: Diagnosis not present

## 2020-05-20 DIAGNOSIS — Z471 Aftercare following joint replacement surgery: Secondary | ICD-10-CM | POA: Diagnosis not present

## 2020-05-20 DIAGNOSIS — Z96642 Presence of left artificial hip joint: Secondary | ICD-10-CM | POA: Diagnosis not present

## 2020-05-22 DIAGNOSIS — M25652 Stiffness of left hip, not elsewhere classified: Secondary | ICD-10-CM | POA: Diagnosis not present

## 2020-05-22 DIAGNOSIS — Z96642 Presence of left artificial hip joint: Secondary | ICD-10-CM | POA: Diagnosis not present

## 2020-05-22 DIAGNOSIS — M6281 Muscle weakness (generalized): Secondary | ICD-10-CM | POA: Diagnosis not present

## 2020-05-27 DIAGNOSIS — Z96642 Presence of left artificial hip joint: Secondary | ICD-10-CM | POA: Diagnosis not present

## 2020-05-27 DIAGNOSIS — M6281 Muscle weakness (generalized): Secondary | ICD-10-CM | POA: Diagnosis not present

## 2020-05-27 DIAGNOSIS — M25652 Stiffness of left hip, not elsewhere classified: Secondary | ICD-10-CM | POA: Diagnosis not present

## 2020-05-29 DIAGNOSIS — M25652 Stiffness of left hip, not elsewhere classified: Secondary | ICD-10-CM | POA: Diagnosis not present

## 2020-05-29 DIAGNOSIS — M6281 Muscle weakness (generalized): Secondary | ICD-10-CM | POA: Diagnosis not present

## 2020-05-29 DIAGNOSIS — Z96642 Presence of left artificial hip joint: Secondary | ICD-10-CM | POA: Diagnosis not present

## 2020-06-03 DIAGNOSIS — M6281 Muscle weakness (generalized): Secondary | ICD-10-CM | POA: Diagnosis not present

## 2020-06-03 DIAGNOSIS — M25652 Stiffness of left hip, not elsewhere classified: Secondary | ICD-10-CM | POA: Diagnosis not present

## 2020-06-03 DIAGNOSIS — Z96642 Presence of left artificial hip joint: Secondary | ICD-10-CM | POA: Diagnosis not present

## 2020-06-05 DIAGNOSIS — Z96642 Presence of left artificial hip joint: Secondary | ICD-10-CM | POA: Diagnosis not present

## 2020-06-05 DIAGNOSIS — M6281 Muscle weakness (generalized): Secondary | ICD-10-CM | POA: Diagnosis not present

## 2020-06-05 DIAGNOSIS — M25652 Stiffness of left hip, not elsewhere classified: Secondary | ICD-10-CM | POA: Diagnosis not present

## 2020-06-17 DIAGNOSIS — M6281 Muscle weakness (generalized): Secondary | ICD-10-CM | POA: Diagnosis not present

## 2020-06-17 DIAGNOSIS — Z96642 Presence of left artificial hip joint: Secondary | ICD-10-CM | POA: Diagnosis not present

## 2020-06-17 DIAGNOSIS — M25652 Stiffness of left hip, not elsewhere classified: Secondary | ICD-10-CM | POA: Diagnosis not present

## 2020-06-19 DIAGNOSIS — Z96642 Presence of left artificial hip joint: Secondary | ICD-10-CM | POA: Diagnosis not present

## 2020-06-19 DIAGNOSIS — M6281 Muscle weakness (generalized): Secondary | ICD-10-CM | POA: Diagnosis not present

## 2020-06-19 DIAGNOSIS — M25652 Stiffness of left hip, not elsewhere classified: Secondary | ICD-10-CM | POA: Diagnosis not present

## 2020-06-26 DIAGNOSIS — D485 Neoplasm of uncertain behavior of skin: Secondary | ICD-10-CM | POA: Diagnosis not present

## 2020-06-26 DIAGNOSIS — L821 Other seborrheic keratosis: Secondary | ICD-10-CM | POA: Diagnosis not present

## 2020-06-26 DIAGNOSIS — L570 Actinic keratosis: Secondary | ICD-10-CM | POA: Diagnosis not present

## 2020-08-13 DIAGNOSIS — Z96642 Presence of left artificial hip joint: Secondary | ICD-10-CM | POA: Diagnosis not present

## 2020-08-13 DIAGNOSIS — Z471 Aftercare following joint replacement surgery: Secondary | ICD-10-CM | POA: Diagnosis not present

## 2020-08-21 DIAGNOSIS — H35371 Puckering of macula, right eye: Secondary | ICD-10-CM | POA: Diagnosis not present

## 2020-08-21 DIAGNOSIS — H524 Presbyopia: Secondary | ICD-10-CM | POA: Diagnosis not present

## 2020-08-21 DIAGNOSIS — H52223 Regular astigmatism, bilateral: Secondary | ICD-10-CM | POA: Diagnosis not present

## 2020-08-21 DIAGNOSIS — H25013 Cortical age-related cataract, bilateral: Secondary | ICD-10-CM | POA: Diagnosis not present

## 2020-08-21 DIAGNOSIS — H5203 Hypermetropia, bilateral: Secondary | ICD-10-CM | POA: Diagnosis not present

## 2020-09-17 DIAGNOSIS — I2510 Atherosclerotic heart disease of native coronary artery without angina pectoris: Secondary | ICD-10-CM | POA: Diagnosis not present

## 2020-09-17 DIAGNOSIS — E7800 Pure hypercholesterolemia, unspecified: Secondary | ICD-10-CM | POA: Diagnosis not present

## 2020-09-17 DIAGNOSIS — K219 Gastro-esophageal reflux disease without esophagitis: Secondary | ICD-10-CM | POA: Diagnosis not present

## 2020-09-17 DIAGNOSIS — R39198 Other difficulties with micturition: Secondary | ICD-10-CM | POA: Diagnosis not present

## 2020-09-17 DIAGNOSIS — Z0000 Encounter for general adult medical examination without abnormal findings: Secondary | ICD-10-CM | POA: Diagnosis not present

## 2020-09-17 DIAGNOSIS — M1612 Unilateral primary osteoarthritis, left hip: Secondary | ICD-10-CM | POA: Diagnosis not present

## 2020-09-17 DIAGNOSIS — Z8673 Personal history of transient ischemic attack (TIA), and cerebral infarction without residual deficits: Secondary | ICD-10-CM | POA: Diagnosis not present

## 2020-09-17 DIAGNOSIS — Q211 Atrial septal defect: Secondary | ICD-10-CM | POA: Diagnosis not present

## 2020-09-17 LAB — COMPREHENSIVE METABOLIC PANEL
Albumin: 4.3 (ref 3.5–5.0)
Calcium: 9.4 (ref 8.7–10.7)

## 2020-09-17 LAB — BASIC METABOLIC PANEL
BUN: 23 — AB (ref 4–21)
CO2: 28 — AB (ref 13–22)
Chloride: 105 (ref 99–108)
Creatinine: 1.3 (ref 0.6–1.3)
Glucose: 95
Potassium: 4.7 mEq/L (ref 3.5–5.1)
Sodium: 140 (ref 137–147)

## 2020-09-17 LAB — HEPATIC FUNCTION PANEL
ALT: 14 U/L (ref 10–40)
AST: 15 (ref 14–40)
Alkaline Phosphatase: 47 (ref 25–125)
Bilirubin, Total: 1

## 2020-09-17 LAB — LIPID PANEL
Cholesterol: 127 (ref 0–200)
HDL: 44 (ref 35–70)
LDL Cholesterol: 70
LDl/HDL Ratio: 2.9
Triglycerides: 58 (ref 40–160)

## 2020-10-14 DIAGNOSIS — R0789 Other chest pain: Secondary | ICD-10-CM | POA: Diagnosis not present

## 2020-12-03 DIAGNOSIS — Z471 Aftercare following joint replacement surgery: Secondary | ICD-10-CM | POA: Diagnosis not present

## 2020-12-03 DIAGNOSIS — Z96642 Presence of left artificial hip joint: Secondary | ICD-10-CM | POA: Diagnosis not present

## 2020-12-10 DIAGNOSIS — Z471 Aftercare following joint replacement surgery: Secondary | ICD-10-CM | POA: Diagnosis not present

## 2020-12-10 DIAGNOSIS — Z96642 Presence of left artificial hip joint: Secondary | ICD-10-CM | POA: Diagnosis not present

## 2021-02-07 DIAGNOSIS — U071 COVID-19: Secondary | ICD-10-CM | POA: Diagnosis not present

## 2021-02-11 DIAGNOSIS — Z20822 Contact with and (suspected) exposure to covid-19: Secondary | ICD-10-CM | POA: Diagnosis not present

## 2021-02-11 DIAGNOSIS — U071 COVID-19: Secondary | ICD-10-CM | POA: Diagnosis not present

## 2021-03-14 DIAGNOSIS — Z23 Encounter for immunization: Secondary | ICD-10-CM | POA: Diagnosis not present

## 2021-04-08 DIAGNOSIS — D508 Other iron deficiency anemias: Secondary | ICD-10-CM | POA: Diagnosis not present

## 2021-04-08 DIAGNOSIS — Q2112 Patent foramen ovale: Secondary | ICD-10-CM | POA: Diagnosis not present

## 2021-04-08 DIAGNOSIS — I2510 Atherosclerotic heart disease of native coronary artery without angina pectoris: Secondary | ICD-10-CM | POA: Diagnosis not present

## 2021-04-08 DIAGNOSIS — R0602 Shortness of breath: Secondary | ICD-10-CM | POA: Diagnosis not present

## 2021-04-08 DIAGNOSIS — I253 Aneurysm of heart: Secondary | ICD-10-CM | POA: Diagnosis not present

## 2021-04-08 LAB — IRON,TIBC AND FERRITIN PANEL
%SAT: 18
Ferritin: 89
Iron: 68
TIBC: 376

## 2021-04-08 LAB — PRO B NATRIURETIC PEPTIDE: NT-Pro BNP: 107

## 2021-05-27 DIAGNOSIS — Z23 Encounter for immunization: Secondary | ICD-10-CM | POA: Diagnosis not present

## 2021-06-02 DIAGNOSIS — R0602 Shortness of breath: Secondary | ICD-10-CM | POA: Diagnosis not present

## 2021-06-02 DIAGNOSIS — Q2112 Patent foramen ovale: Secondary | ICD-10-CM | POA: Diagnosis not present

## 2021-06-02 DIAGNOSIS — D508 Other iron deficiency anemias: Secondary | ICD-10-CM | POA: Diagnosis not present

## 2021-06-02 DIAGNOSIS — I350 Nonrheumatic aortic (valve) stenosis: Secondary | ICD-10-CM | POA: Diagnosis not present

## 2021-06-02 DIAGNOSIS — I2510 Atherosclerotic heart disease of native coronary artery without angina pectoris: Secondary | ICD-10-CM | POA: Diagnosis not present

## 2021-06-02 DIAGNOSIS — I253 Aneurysm of heart: Secondary | ICD-10-CM | POA: Diagnosis not present

## 2021-06-12 DIAGNOSIS — Z8639 Personal history of other endocrine, nutritional and metabolic disease: Secondary | ICD-10-CM | POA: Diagnosis not present

## 2021-06-12 DIAGNOSIS — I2510 Atherosclerotic heart disease of native coronary artery without angina pectoris: Secondary | ICD-10-CM | POA: Diagnosis not present

## 2021-07-29 DIAGNOSIS — M25552 Pain in left hip: Secondary | ICD-10-CM | POA: Diagnosis not present

## 2021-07-31 DIAGNOSIS — M25552 Pain in left hip: Secondary | ICD-10-CM | POA: Diagnosis not present

## 2021-08-05 DIAGNOSIS — M7072 Other bursitis of hip, left hip: Secondary | ICD-10-CM | POA: Diagnosis not present

## 2021-09-10 DIAGNOSIS — H35371 Puckering of macula, right eye: Secondary | ICD-10-CM | POA: Diagnosis not present

## 2021-09-10 DIAGNOSIS — H52223 Regular astigmatism, bilateral: Secondary | ICD-10-CM | POA: Diagnosis not present

## 2021-09-10 DIAGNOSIS — H524 Presbyopia: Secondary | ICD-10-CM | POA: Diagnosis not present

## 2021-09-10 DIAGNOSIS — H5203 Hypermetropia, bilateral: Secondary | ICD-10-CM | POA: Diagnosis not present

## 2021-09-10 DIAGNOSIS — H40052 Ocular hypertension, left eye: Secondary | ICD-10-CM | POA: Diagnosis not present

## 2021-09-10 DIAGNOSIS — H25013 Cortical age-related cataract, bilateral: Secondary | ICD-10-CM | POA: Diagnosis not present

## 2021-09-18 DIAGNOSIS — Z96642 Presence of left artificial hip joint: Secondary | ICD-10-CM | POA: Diagnosis not present

## 2021-09-18 DIAGNOSIS — M7062 Trochanteric bursitis, left hip: Secondary | ICD-10-CM | POA: Diagnosis not present

## 2021-09-18 DIAGNOSIS — M7061 Trochanteric bursitis, right hip: Secondary | ICD-10-CM | POA: Diagnosis not present

## 2021-09-18 DIAGNOSIS — M47816 Spondylosis without myelopathy or radiculopathy, lumbar region: Secondary | ICD-10-CM | POA: Diagnosis not present

## 2021-09-21 DIAGNOSIS — M7072 Other bursitis of hip, left hip: Secondary | ICD-10-CM | POA: Diagnosis not present

## 2021-09-23 DIAGNOSIS — R0600 Dyspnea, unspecified: Secondary | ICD-10-CM | POA: Diagnosis not present

## 2021-09-23 DIAGNOSIS — N289 Disorder of kidney and ureter, unspecified: Secondary | ICD-10-CM | POA: Diagnosis not present

## 2021-09-23 DIAGNOSIS — K219 Gastro-esophageal reflux disease without esophagitis: Secondary | ICD-10-CM | POA: Diagnosis not present

## 2021-09-23 DIAGNOSIS — I2510 Atherosclerotic heart disease of native coronary artery without angina pectoris: Secondary | ICD-10-CM | POA: Diagnosis not present

## 2021-09-23 DIAGNOSIS — R030 Elevated blood-pressure reading, without diagnosis of hypertension: Secondary | ICD-10-CM | POA: Diagnosis not present

## 2021-09-23 DIAGNOSIS — Z0000 Encounter for general adult medical examination without abnormal findings: Secondary | ICD-10-CM | POA: Diagnosis not present

## 2021-09-23 DIAGNOSIS — E7800 Pure hypercholesterolemia, unspecified: Secondary | ICD-10-CM | POA: Diagnosis not present

## 2021-09-23 DIAGNOSIS — Z1389 Encounter for screening for other disorder: Secondary | ICD-10-CM | POA: Diagnosis not present

## 2021-09-23 DIAGNOSIS — I350 Nonrheumatic aortic (valve) stenosis: Secondary | ICD-10-CM | POA: Diagnosis not present

## 2021-09-23 LAB — BASIC METABOLIC PANEL
BUN: 37 — AB (ref 4–21)
CO2: 29 — AB (ref 13–22)
Chloride: 101 (ref 99–108)
Creatinine: 1.8 — AB (ref 0.6–1.3)
Glucose: 88
Potassium: 4.7 mEq/L (ref 3.5–5.1)
Sodium: 136 — AB (ref 137–147)

## 2021-09-23 LAB — LIPID PANEL
Cholesterol: 131 (ref 0–200)
HDL: 49 (ref 35–70)
LDL Cholesterol: 67
LDl/HDL Ratio: 2.7
Triglycerides: 76 (ref 40–160)

## 2021-09-23 LAB — HEPATIC FUNCTION PANEL
ALT: 18 U/L (ref 10–40)
AST: 25 (ref 14–40)
Alkaline Phosphatase: 41 (ref 25–125)
Bilirubin, Total: 1.2

## 2021-09-23 LAB — CBC AND DIFFERENTIAL
HCT: 40 — AB (ref 41–53)
Hemoglobin: 13.5 (ref 13.5–17.5)
Platelets: 242 10*3/uL (ref 150–400)
WBC: 10.4

## 2021-09-23 LAB — COMPREHENSIVE METABOLIC PANEL
Albumin: 4.2 (ref 3.5–5.0)
Calcium: 9.5 (ref 8.7–10.7)

## 2021-09-23 LAB — CBC: RBC: 4.88 (ref 3.87–5.11)

## 2021-09-25 DIAGNOSIS — N2889 Other specified disorders of kidney and ureter: Secondary | ICD-10-CM | POA: Diagnosis not present

## 2021-10-08 DIAGNOSIS — N289 Disorder of kidney and ureter, unspecified: Secondary | ICD-10-CM | POA: Diagnosis not present

## 2021-11-19 DIAGNOSIS — N289 Disorder of kidney and ureter, unspecified: Secondary | ICD-10-CM | POA: Diagnosis not present

## 2021-11-19 LAB — COMPREHENSIVE METABOLIC PANEL
Albumin: 4.1 (ref 3.5–5.0)
Calcium: 9.4 (ref 8.7–10.7)

## 2021-11-19 LAB — BASIC METABOLIC PANEL
BUN: 20 (ref 4–21)
CO2: 29 — AB (ref 13–22)
Chloride: 104 (ref 99–108)
Creatinine: 1.4 — AB (ref 0.6–1.3)
Glucose: 87
Potassium: 4.9 mEq/L (ref 3.5–5.1)
Sodium: 139 (ref 137–147)

## 2022-01-06 DIAGNOSIS — M7072 Other bursitis of hip, left hip: Secondary | ICD-10-CM | POA: Diagnosis not present

## 2022-03-03 DIAGNOSIS — Z23 Encounter for immunization: Secondary | ICD-10-CM | POA: Diagnosis not present

## 2022-03-16 IMAGING — CR DG CHEST 2V
2 series · 2 of 2 positions shown · non-contrast
Comparison: None.

CLINICAL DATA: Preop evaluation.

EXAM:
CHEST - 2 VIEW

[w chest lat]
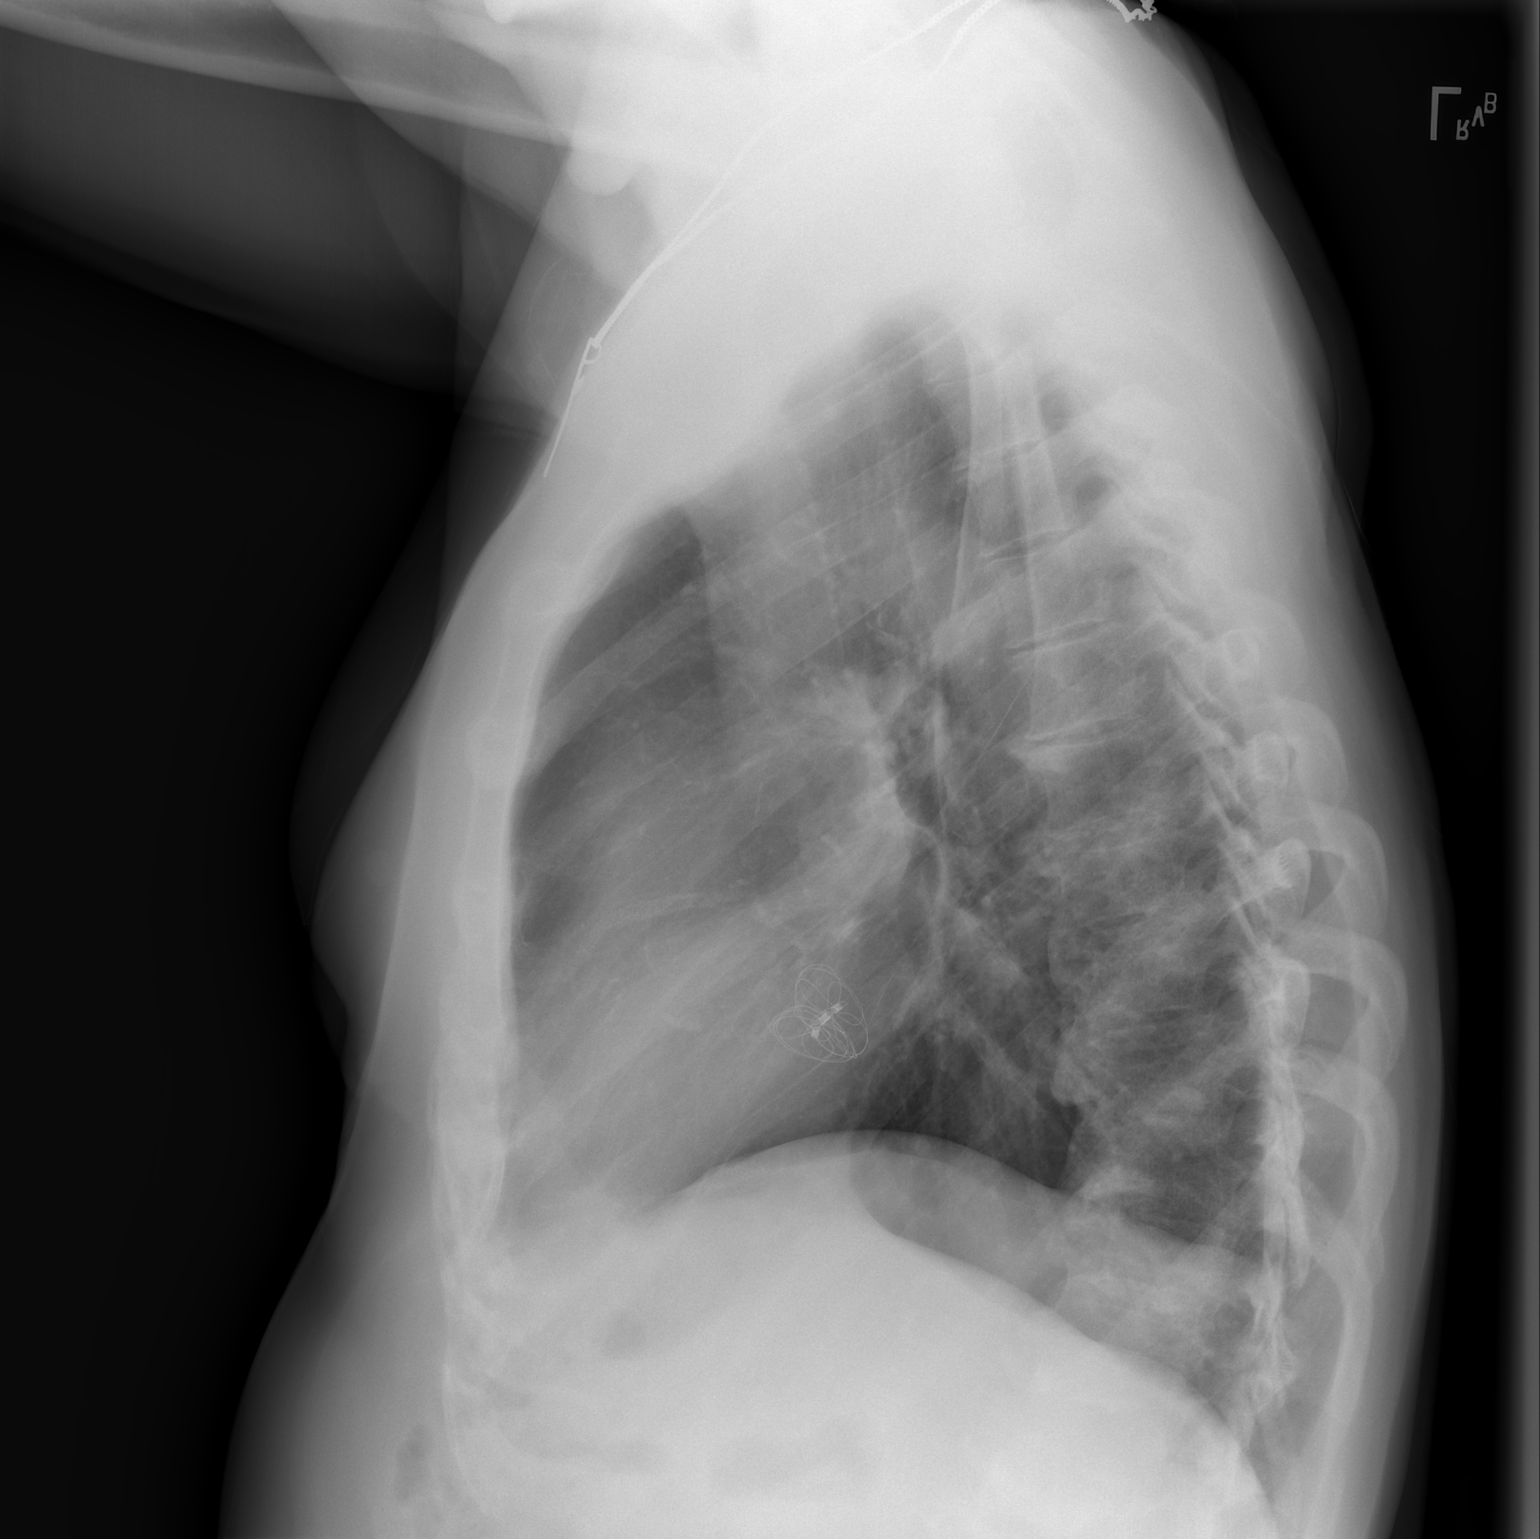

[w chest pa]
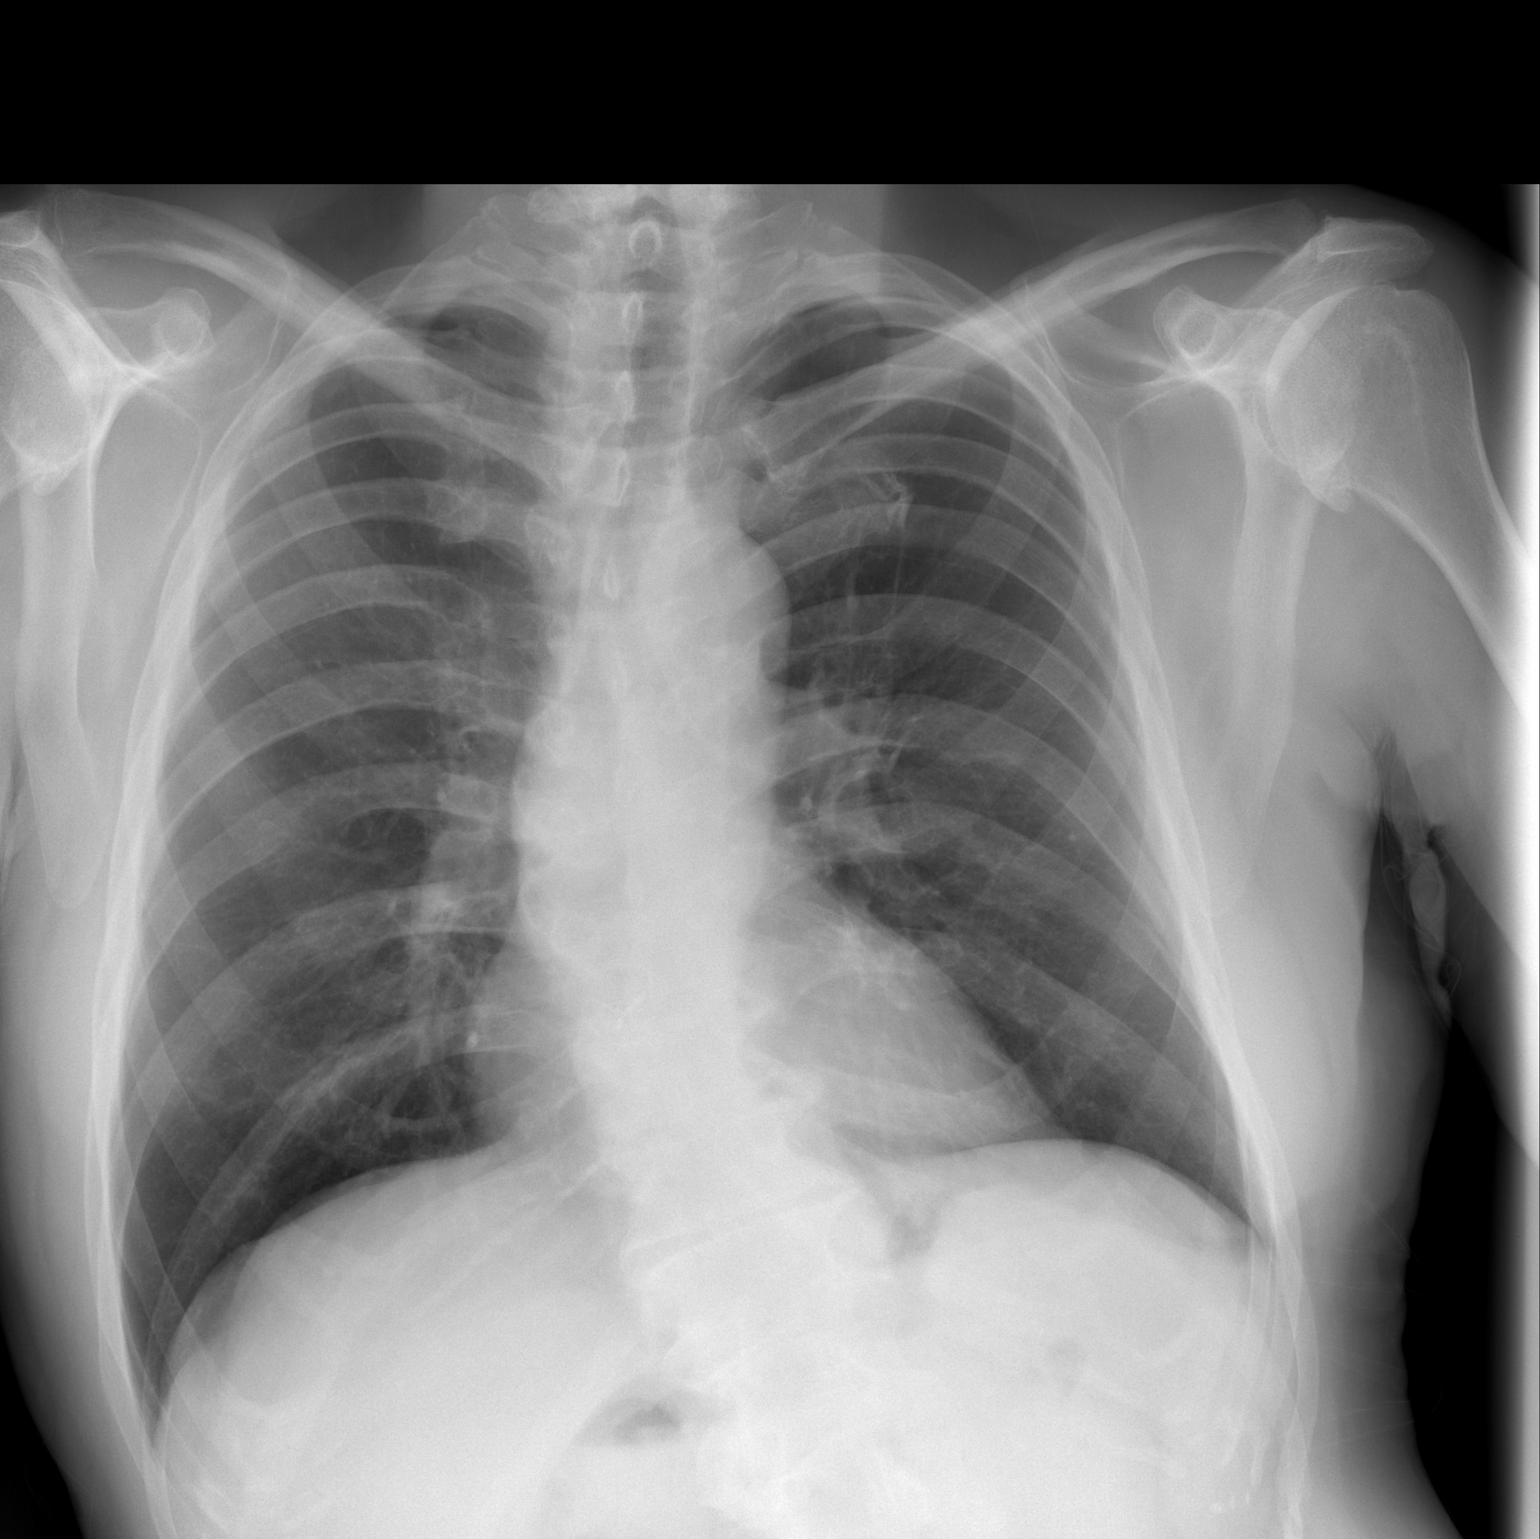

[2 of 2 positions shown; findings below may reference images not displayed]

FINDINGS: Postprocedural appearance of the cardiac silhouette. Both lungs are
clear. No pneumothorax or pleural effusion. Multilevel spondylosis.
IMPRESSION: No focal airspace disease.

## 2022-03-23 IMAGING — RF DG HIP (WITH PELVIS) OPERATIVE*L*
1 series · 2 of 2 positions shown · non-contrast
Comparison: 02/29/2020

CLINICAL DATA: Left hip replacement

EXAM:
OPERATIVE left HIP (WITH PELVIS IF PERFORMED) 2 VIEWS
TECHNIQUE: Fluoroscopic spot image(s) were submitted for interpretation
post-operatively.

[Series 1: unknown protocol · 0.20mm/px · 2 of 2 slices shown]
[im 1/2]
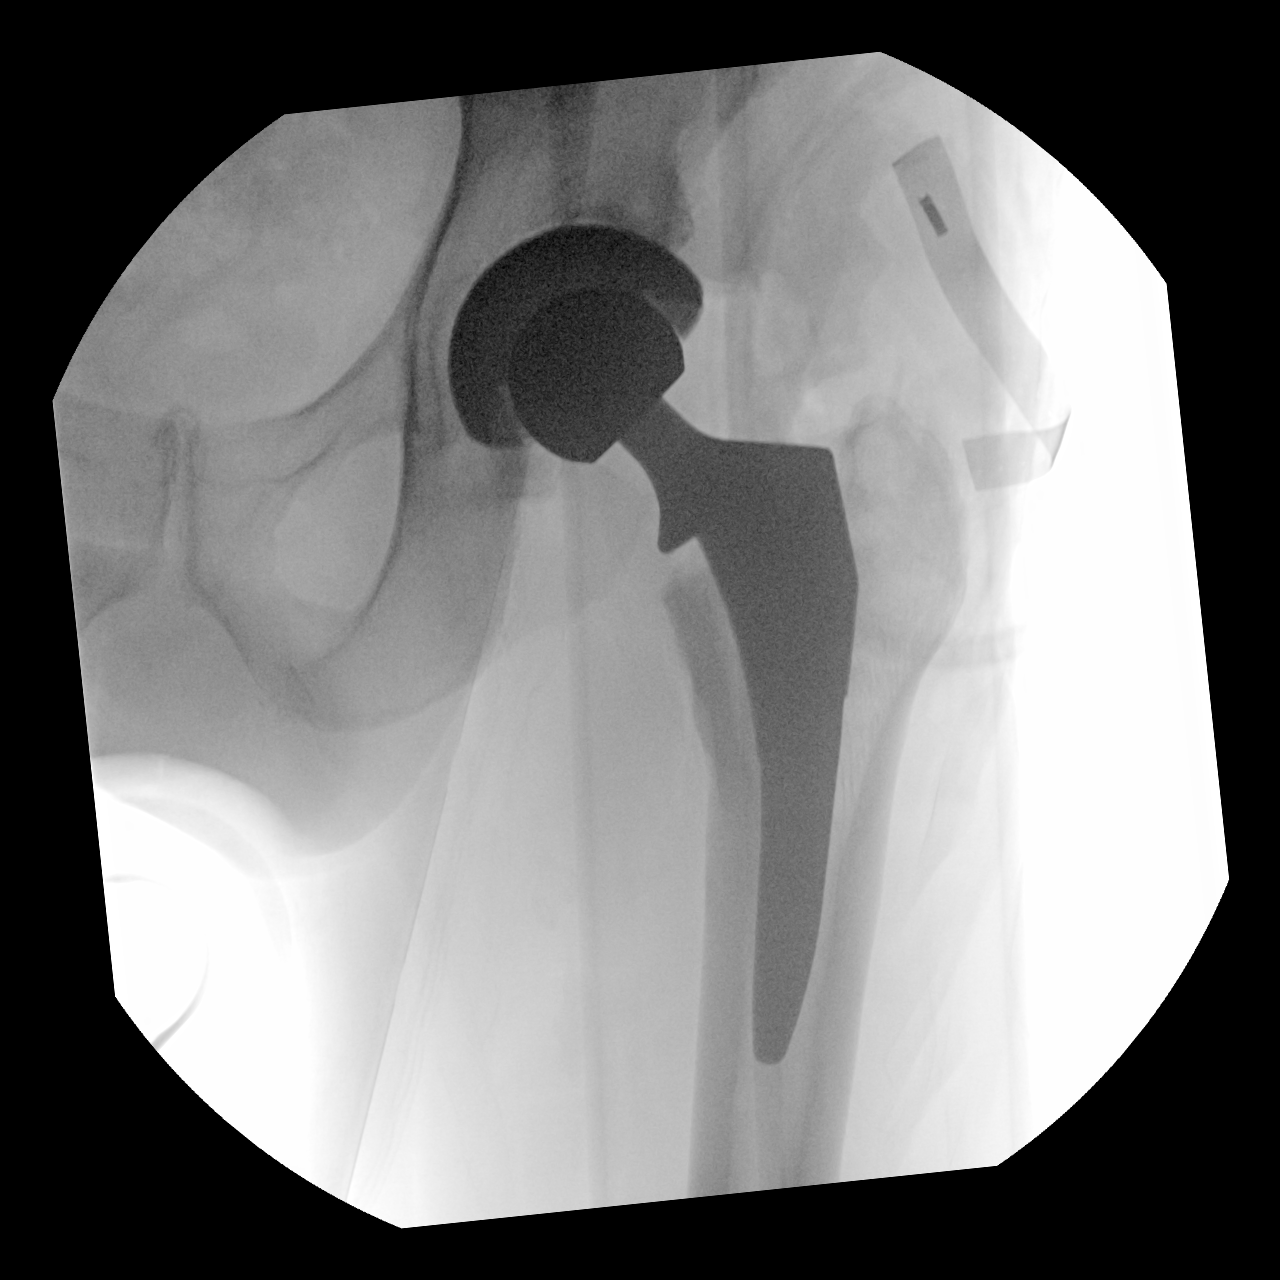
[im 2/2]
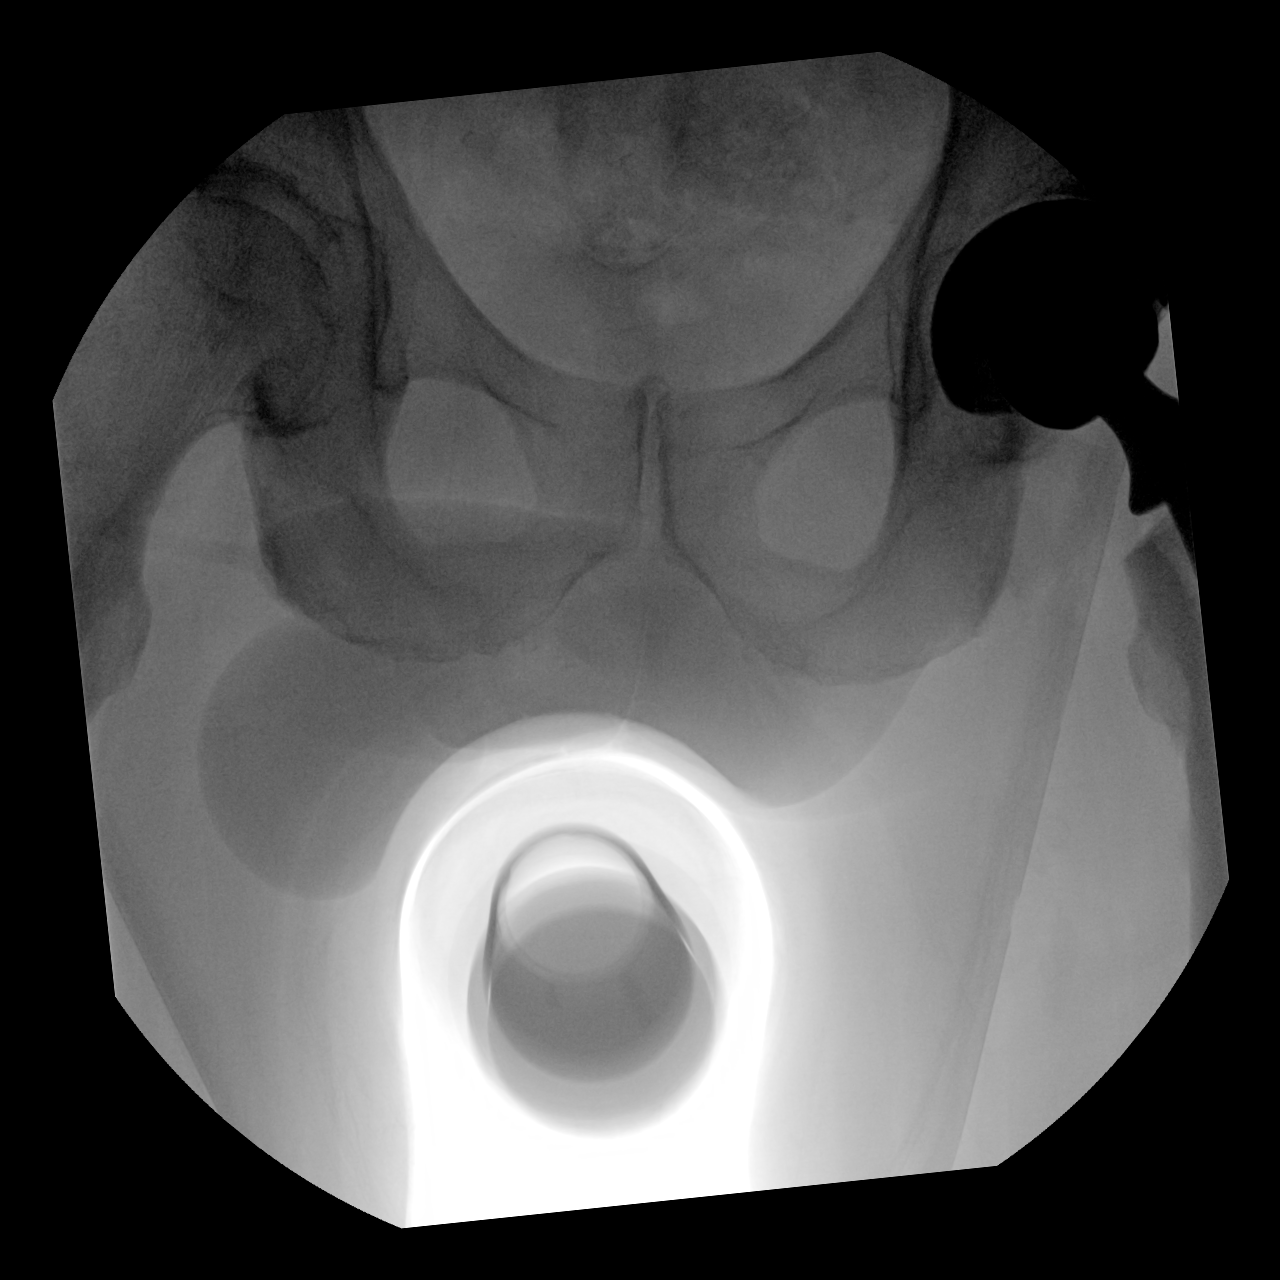

[2 of 2 positions shown; findings below may reference images not displayed]

FINDINGS: Left hip replacement in satisfactory position and alignment. No
fracture or complication.
IMPRESSION: Satisfactory left hip replacement.

## 2022-03-23 IMAGING — RF DG C-ARM 1-60 MIN-NO REPORT
1 series · 2 of 2 positions shown · non-contrast
Comparison: 02/29/2020

CLINICAL DATA: Left hip replacement

EXAM:
OPERATIVE left HIP (WITH PELVIS IF PERFORMED) 2 VIEWS
TECHNIQUE: Fluoroscopic spot image(s) were submitted for interpretation
post-operatively.

[Series 1: unknown protocol · 0.20mm/px · 2 of 2 slices shown]
[im 1/2]
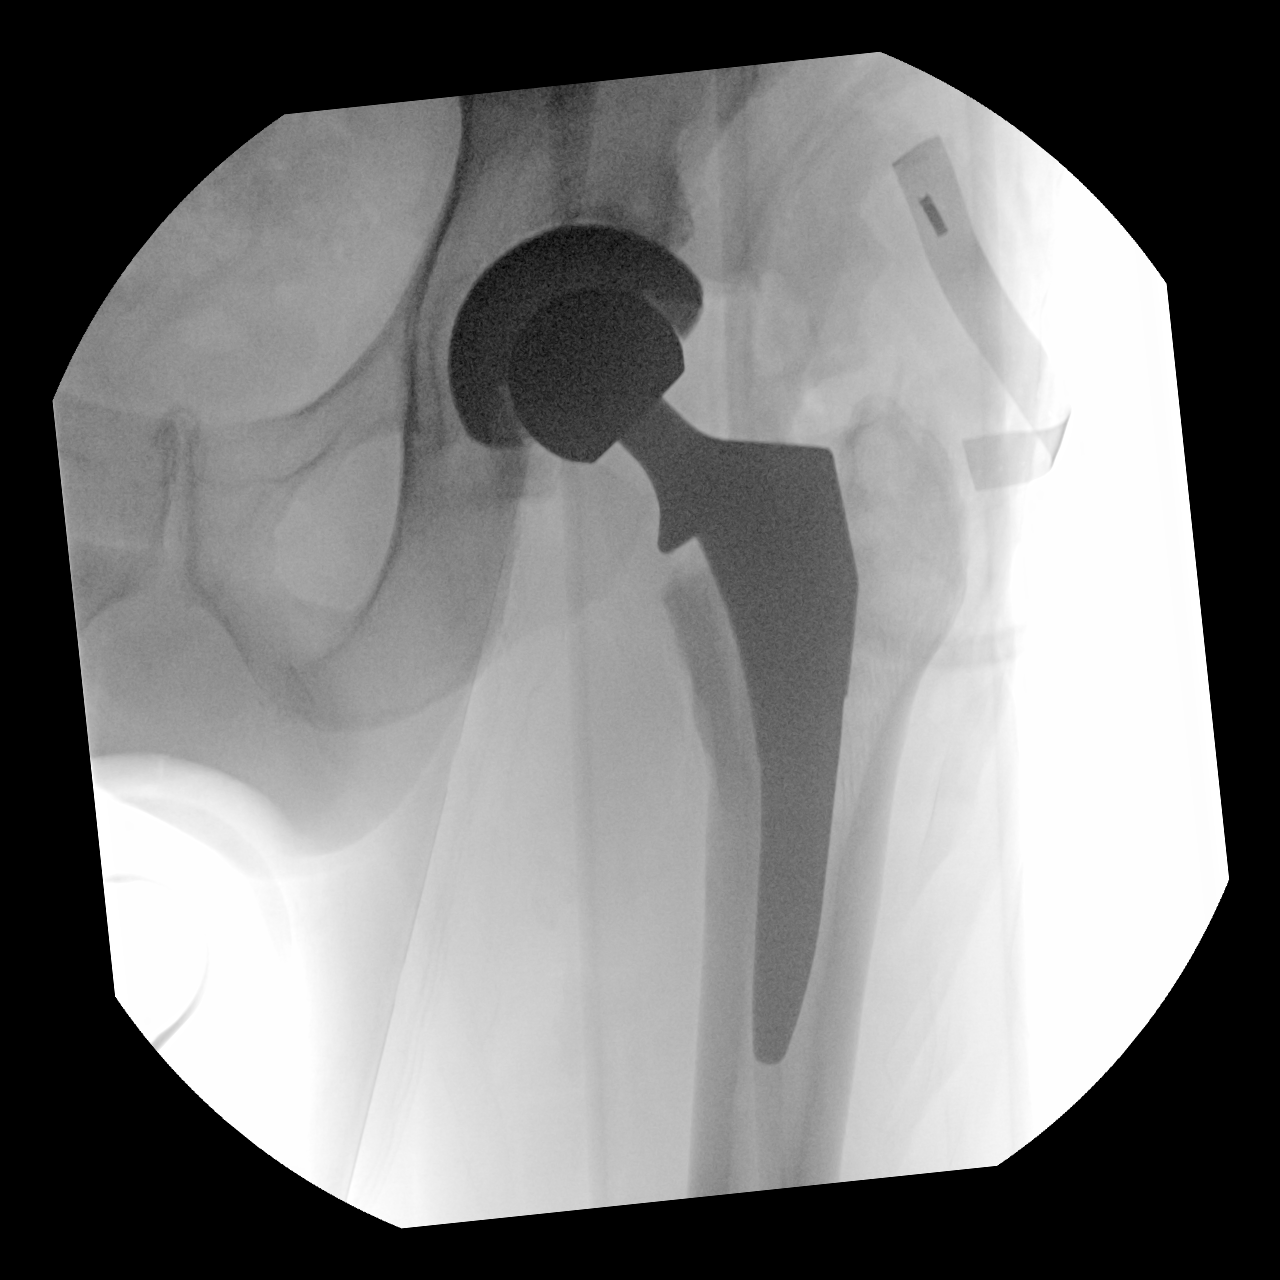
[im 2/2]
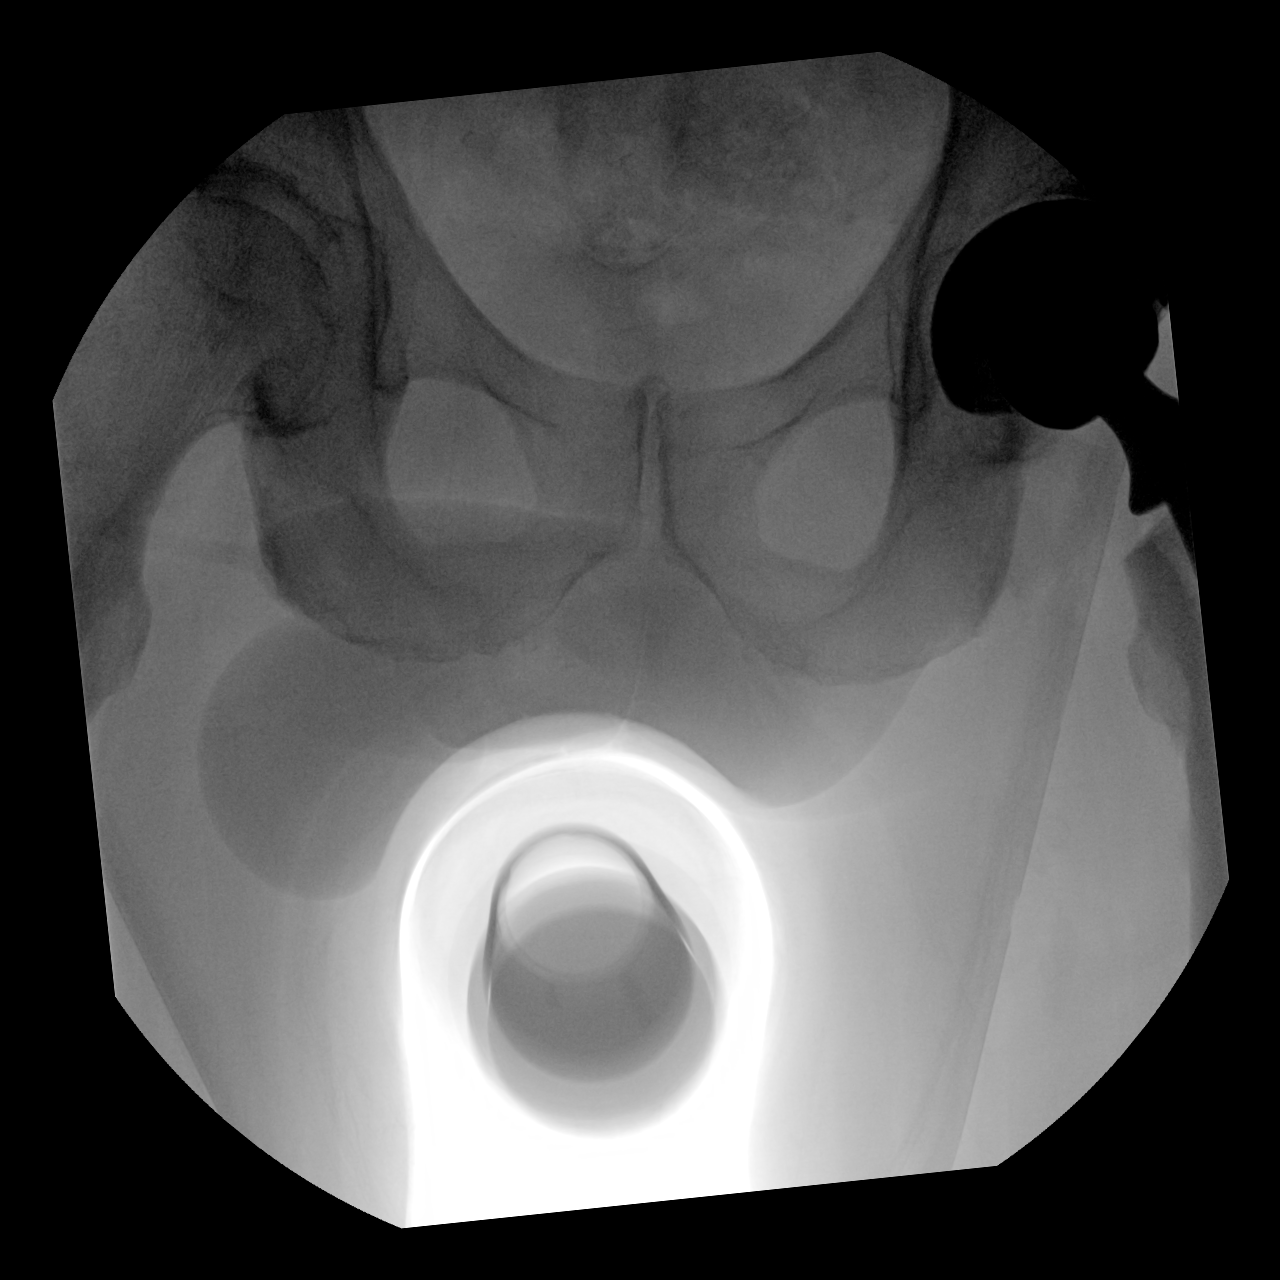

[2 of 2 positions shown; findings below may reference images not displayed]

FINDINGS: Left hip replacement in satisfactory position and alignment. No
fracture or complication.
IMPRESSION: Satisfactory left hip replacement.

## 2022-03-24 ENCOUNTER — Ambulatory Visit (INDEPENDENT_AMBULATORY_CARE_PROVIDER_SITE_OTHER): Payer: Medicare Other | Admitting: Nurse Practitioner

## 2022-03-24 ENCOUNTER — Encounter: Payer: Self-pay | Admitting: Internal Medicine

## 2022-03-24 ENCOUNTER — Encounter: Payer: Self-pay | Admitting: Nurse Practitioner

## 2022-03-24 ENCOUNTER — Ambulatory Visit (INDEPENDENT_AMBULATORY_CARE_PROVIDER_SITE_OTHER)
Admission: RE | Admit: 2022-03-24 | Discharge: 2022-03-24 | Disposition: A | Discharge status: Home / Self Care | Payer: Medicare Other | Source: Ambulatory Visit | Attending: Nurse Practitioner | Admitting: Nurse Practitioner

## 2022-03-24 VITALS — BP 120/78 | HR 84 | Temp 97.1°F | Resp 12 | Ht 72.0 in | Wt 181.5 lb

## 2022-03-24 DIAGNOSIS — I2510 Atherosclerotic heart disease of native coronary artery without angina pectoris: Secondary | ICD-10-CM | POA: Diagnosis not present

## 2022-03-24 DIAGNOSIS — R0600 Dyspnea, unspecified: Secondary | ICD-10-CM | POA: Diagnosis not present

## 2022-03-24 DIAGNOSIS — I10 Essential (primary) hypertension: Secondary | ICD-10-CM | POA: Diagnosis not present

## 2022-03-24 DIAGNOSIS — R0609 Other forms of dyspnea: Secondary | ICD-10-CM

## 2022-03-24 DIAGNOSIS — K219 Gastro-esophageal reflux disease without esophagitis: Secondary | ICD-10-CM | POA: Diagnosis not present

## 2022-03-24 DIAGNOSIS — I6310 Cerebral infarction due to embolism of unspecified precerebral artery: Secondary | ICD-10-CM | POA: Diagnosis not present

## 2022-03-24 DIAGNOSIS — I253 Aneurysm of heart: Secondary | ICD-10-CM

## 2022-03-24 DIAGNOSIS — Q2112 Patent foramen ovale: Secondary | ICD-10-CM | POA: Diagnosis not present

## 2022-03-24 DIAGNOSIS — N289 Disorder of kidney and ureter, unspecified: Secondary | ICD-10-CM

## 2022-03-24 DIAGNOSIS — Z7689 Persons encountering health services in other specified circumstances: Secondary | ICD-10-CM

## 2022-03-24 LAB — COMPREHENSIVE METABOLIC PANEL
ALT: 17 U/L (ref 0–53)
AST: 22 U/L (ref 0–37)
Albumin: 4.3 g/dL (ref 3.5–5.2)
Alkaline Phosphatase: 37 U/L — ABNORMAL LOW (ref 39–117)
BUN: 26 mg/dL — ABNORMAL HIGH (ref 6–23)
CO2: 28 mEq/L (ref 19–32)
Calcium: 9 mg/dL (ref 8.4–10.5)
Chloride: 104 mEq/L (ref 96–112)
Creatinine, Ser: 1.36 mg/dL (ref 0.40–1.50)
GFR: 50.51 mL/min — ABNORMAL LOW (ref >60.00)
Glucose, Bld: 90 mg/dL (ref 70–99)
Potassium: 4.1 mEq/L (ref 3.5–5.1)
Sodium: 138 mEq/L (ref 135–145)
Total Bilirubin: 1.2 mg/dL (ref 0.2–1.2)
Total Protein: 6.9 g/dL (ref 6.0–8.3)

## 2022-03-24 LAB — CBC
HCT: 44.4 % (ref 39.0–52.0)
Hemoglobin: 14.8 g/dL (ref 13.0–17.0)
MCHC: 33.2 g/dL (ref 30.0–36.0)
MCV: 85.2 fl (ref 78.0–100.0)
Platelets: 219 10*3/uL (ref 150.0–400.0)
RBC: 5.21 Mil/uL (ref 4.22–5.81)
RDW: 14.2 % (ref 11.5–15.5)
WBC: 6.2 10*3/uL (ref 4.0–10.5)

## 2022-03-24 NOTE — Assessment & Plan Note (Signed)
Patient brought in several lab results from his previous provider that showed decreased renal function.  Pending lab work today

## 2022-03-24 NOTE — Assessment & Plan Note (Signed)
Patient does have Protonix 40 mg a takes on a as needed basis.

## 2022-03-24 NOTE — Assessment & Plan Note (Signed)
Per patient report this was a TIA not a CVA.  Likely secondary to PFO that has now been fixed

## 2022-03-24 NOTE — Patient Instructions (Addendum)
Nice to see you today I will be in touch with the blood work and the xray results once I have them I will say see me in 6 months, sooner if you need me If the blood work is off I will have you come back in sooner

## 2022-03-24 NOTE — Assessment & Plan Note (Signed)
Has been seen and evaluated by cardiology.  He does have a follow-up in December of this year with cardiology.  Will obtain chest x-ray today, pending result.  Has not seen pulmonologist given patient does have a remote smoking history this may not be a bad idea.  Pending results before making decision and referral
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
## 2022-03-24 NOTE — Assessment & Plan Note (Signed)
Coronary artery disease he has had a left heart catheterization that showed approximately 80% occlusion of the LAD they went ahead and stented patient.  This was done prior to patient having any cardiovascular event.

## 2022-03-24 NOTE — Assessment & Plan Note (Signed)
Was noted about.  They did corrected after patient had a TIA.  He is followed by cardiology.

## 2022-03-29 ENCOUNTER — Encounter: Payer: Self-pay | Admitting: Nurse Practitioner

## 2022-03-29 NOTE — Telephone Encounter (Signed)
I updated Tetanus information

## 2022-04-02 DIAGNOSIS — N2889 Other specified disorders of kidney and ureter: Secondary | ICD-10-CM | POA: Diagnosis not present

## 2022-04-07 ENCOUNTER — Encounter: Payer: Self-pay | Admitting: Nurse Practitioner

## 2022-04-07 DIAGNOSIS — N2889 Other specified disorders of kidney and ureter: Secondary | ICD-10-CM

## 2022-04-28 ENCOUNTER — Other Ambulatory Visit: Payer: Self-pay | Admitting: Nurse Practitioner

## 2022-05-11 ENCOUNTER — Ambulatory Visit
Admission: RE | Admit: 2022-05-11 | Discharge: 2022-05-11 | Disposition: A | Discharge status: Home / Self Care | Payer: Medicare Other | Source: Ambulatory Visit | Attending: Nurse Practitioner | Admitting: Nurse Practitioner

## 2022-05-11 DIAGNOSIS — I517 Cardiomegaly: Secondary | ICD-10-CM | POA: Diagnosis not present

## 2022-05-11 DIAGNOSIS — K5730 Diverticulosis of large intestine without perforation or abscess without bleeding: Secondary | ICD-10-CM | POA: Diagnosis not present

## 2022-05-11 DIAGNOSIS — N2889 Other specified disorders of kidney and ureter: Secondary | ICD-10-CM

## 2022-05-11 DIAGNOSIS — K7689 Other specified diseases of liver: Secondary | ICD-10-CM | POA: Diagnosis not present

## 2022-05-11 DIAGNOSIS — N281 Cyst of kidney, acquired: Secondary | ICD-10-CM | POA: Diagnosis not present

## 2022-05-11 MED ORDER — GADOBENATE DIMEGLUMINE 529 MG/ML IV SOLN
17.0000 mL | Freq: Once | INTRAVENOUS | Status: AC | PRN
  Administered 2022-05-11: 17 mL via INTRAVENOUS

## 2022-05-13 ENCOUNTER — Encounter: Payer: Self-pay | Admitting: Nurse Practitioner

## 2022-05-26 DIAGNOSIS — I350 Nonrheumatic aortic (valve) stenosis: Secondary | ICD-10-CM | POA: Diagnosis not present

## 2022-05-26 DIAGNOSIS — I253 Aneurysm of heart: Secondary | ICD-10-CM | POA: Diagnosis not present

## 2022-05-26 DIAGNOSIS — D508 Other iron deficiency anemias: Secondary | ICD-10-CM | POA: Diagnosis not present

## 2022-05-26 DIAGNOSIS — I2510 Atherosclerotic heart disease of native coronary artery without angina pectoris: Secondary | ICD-10-CM | POA: Diagnosis not present

## 2022-05-26 DIAGNOSIS — Q2112 Patent foramen ovale: Secondary | ICD-10-CM | POA: Diagnosis not present

## 2022-05-26 DIAGNOSIS — R0602 Shortness of breath: Secondary | ICD-10-CM | POA: Diagnosis not present

## 2022-06-30 ENCOUNTER — Encounter: Payer: Self-pay | Admitting: Nurse Practitioner

## 2022-09-22 ENCOUNTER — Ambulatory Visit (INDEPENDENT_AMBULATORY_CARE_PROVIDER_SITE_OTHER): Payer: Medicare Other | Admitting: Nurse Practitioner

## 2022-09-22 ENCOUNTER — Encounter: Payer: Self-pay | Admitting: Nurse Practitioner

## 2022-09-22 VITALS — BP 118/60 | HR 55 | Temp 97.7°F | Resp 16 | Ht 72.0 in | Wt 182.5 lb

## 2022-09-22 DIAGNOSIS — N289 Disorder of kidney and ureter, unspecified: Secondary | ICD-10-CM | POA: Diagnosis not present

## 2022-09-22 DIAGNOSIS — I2510 Atherosclerotic heart disease of native coronary artery without angina pectoris: Secondary | ICD-10-CM | POA: Diagnosis not present

## 2022-09-22 DIAGNOSIS — Z8673 Personal history of transient ischemic attack (TIA), and cerebral infarction without residual deficits: Secondary | ICD-10-CM | POA: Insufficient documentation

## 2022-09-22 DIAGNOSIS — Z87891 Personal history of nicotine dependence: Secondary | ICD-10-CM | POA: Diagnosis not present

## 2022-09-22 DIAGNOSIS — R0609 Other forms of dyspnea: Secondary | ICD-10-CM | POA: Diagnosis not present

## 2022-09-22 DIAGNOSIS — Z125 Encounter for screening for malignant neoplasm of prostate: Secondary | ICD-10-CM | POA: Diagnosis not present

## 2022-09-22 DIAGNOSIS — I10 Essential (primary) hypertension: Secondary | ICD-10-CM

## 2022-09-22 LAB — COMPREHENSIVE METABOLIC PANEL
ALT: 13 U/L (ref 0–53)
AST: 18 U/L (ref 0–37)
Albumin: 4 g/dL (ref 3.5–5.2)
Alkaline Phosphatase: 34 U/L — ABNORMAL LOW (ref 39–117)
BUN: 22 mg/dL (ref 6–23)
CO2: 27 mEq/L (ref 19–32)
Calcium: 9.2 mg/dL (ref 8.4–10.5)
Chloride: 103 mEq/L (ref 96–112)
Creatinine, Ser: 1.41 mg/dL (ref 0.40–1.50)
GFR: 48.2 mL/min — ABNORMAL LOW (ref >60.00)
Glucose, Bld: 92 mg/dL (ref 70–99)
Potassium: 4.8 mEq/L (ref 3.5–5.1)
Sodium: 136 mEq/L (ref 135–145)
Total Bilirubin: 1.6 mg/dL — ABNORMAL HIGH (ref 0.2–1.2)
Total Protein: 6.2 g/dL (ref 6.0–8.3)

## 2022-09-22 LAB — LIPID PANEL
Cholesterol: 125 mg/dL (ref 0–200)
HDL: 52.3 mg/dL (ref >39.00)
LDL Cholesterol: 60 mg/dL (ref 0–99)
NonHDL: 72.23
Total CHOL/HDL Ratio: 2
Triglycerides: 63 mg/dL (ref 0.0–149.0)
VLDL: 12.6 mg/dL (ref 0.0–40.0)

## 2022-09-22 LAB — CBC
HCT: 41.5 % (ref 39.0–52.0)
Hemoglobin: 14.2 g/dL (ref 13.0–17.0)
MCHC: 34.1 g/dL (ref 30.0–36.0)
MCV: 84.7 fl (ref 78.0–100.0)
Platelets: 220 10*3/uL (ref 150.0–400.0)
RBC: 4.9 Mil/uL (ref 4.22–5.81)
RDW: 13.9 % (ref 11.5–15.5)
WBC: 4.5 10*3/uL (ref 4.0–10.5)

## 2022-09-22 LAB — URINALYSIS, MICROSCOPIC ONLY: RBC / HPF: NONE SEEN (ref 0–?)

## 2022-09-22 LAB — PSA, MEDICARE: PSA: 1.07 ng/ml (ref 0.10–4.00)

## 2022-09-22 NOTE — Assessment & Plan Note (Signed)
History of CVA patient currently on aspirin and statin therapy

## 2022-09-22 NOTE — Progress Notes (Signed)
Established Patient Office Visit  Subjective   Patient ID: Eric Bradley, male    DOB: 1946-04-04  Age: 77 y.o. MRN: [id number]  Chief Complaint  Patient presents with   Medical Management of Chronic Issues    HPI  DOE: is being followed by cardiology and was suppose to see them in December with a possible cath. States that he was happy with the Echo and no cath. States that the shortness of breath may be just age-related.  He is still able to do activities of life like golfing  HTN: does check bp at home.  Patient currently maintained on lisinopril 20 mg and doing well.  HiP: left hip that bothers. States that he has had a hip reoplacement and injections. States that he has done PT and accupunture without good relief. States he is still able to play golf but it does bother him  Ear pain: states that over the past coulple weeks. States that it happened twice. The first time was a small pain the second time was worse states it has not happened as of late.  No bleeding from the ear canal or discharge.  No change in hearing per patient report no tinnitus    Review of Systems  Constitutional:  Negative for chills and fever.  HENT:  Positive for ear pain (That has resolved). Negative for hearing loss and tinnitus.   Respiratory:  Positive for shortness of breath (With exertion).   Cardiovascular:  Negative for chest pain.  Neurological:  Negative for headaches.      Objective:     BP 118/60   Pulse (!) 55   Temp 97.7 F (36.5 C)   Resp 16   Ht 6' (1.829 m)   Wt 182 lb 8 oz (82.8 kg)   SpO2 99%   BMI 24.75 kg/m    Physical Exam Vitals and nursing note reviewed.  Constitutional:      Appearance: Normal appearance.  HENT:     Right Ear: Tympanic membrane, ear canal and external ear normal.     Left Ear: Tympanic membrane, ear canal and external ear normal.     Mouth/Throat:     Mouth: Mucous membranes are moist.     Pharynx: Oropharynx is clear.  Cardiovascular:      Rate and Rhythm: Normal rate and regular rhythm.     Pulses: Normal pulses.     Heart sounds: Murmur heard.  Pulmonary:     Effort: Pulmonary effort is normal.     Breath sounds: Normal breath sounds.  Abdominal:     General: Bowel sounds are normal.  Musculoskeletal:     Right lower leg: No edema.     Left lower leg: No edema.  Lymphadenopathy:     Cervical: No cervical adenopathy.  Neurological:     Mental Status: He is alert.      Results for orders placed or performed in visit on 09/22/22  CBC  Result Value Ref Range   WBC 4.5 4.0 - 10.5 K/uL   RBC 4.90 4.22 - 5.81 Mil/uL   Platelets 220.0 150.0 - 400.0 K/uL   Hemoglobin 14.2 13.0 - 17.0 g/dL   HCT 78.2 95.6 - 21.3 %   MCV 84.7 78.0 - 100.0 fl   MCHC 34.1 30.0 - 36.0 g/dL   RDW 08.6 57.8 - 46.9 %  Comprehensive metabolic panel  Result Value Ref Range   Sodium 136 135 - 145 mEq/L   Potassium 4.8 3.5 - 5.1 mEq/L  Chloride 103 96 - 112 mEq/L   CO2 27 19 - 32 mEq/L   Glucose, Bld 92 70 - 99 mg/dL   BUN 22 6 - 23 mg/dL   Creatinine, Ser 1.61 0.40 - 1.50 mg/dL   Total Bilirubin 1.6 (H) 0.2 - 1.2 mg/dL   Alkaline Phosphatase 34 (L) 39 - 117 U/L   AST 18 0 - 37 U/L   ALT 13 0 - 53 U/L   Total Protein 6.2 6.0 - 8.3 g/dL   Albumin 4.0 3.5 - 5.2 g/dL   GFR 09.60 (L) >45.40 mL/min   Calcium 9.2 8.4 - 10.5 mg/dL  Lipid panel  Result Value Ref Range   Cholesterol 125 0 - 200 mg/dL   Triglycerides 98.1 0.0 - 149.0 mg/dL   HDL 19.14 >78.29 mg/dL   VLDL 56.2 0.0 - 13.0 mg/dL   LDL Cholesterol 60 0 - 99 mg/dL   Total CHOL/HDL Ratio 2    NonHDL 72.23   PSA, Medicare  Result Value Ref Range   PSA 1.07 0.10 - 4.00 ng/ml  Urine Microscopic  Result Value Ref Range   WBC, UA 0-2/hpf 0-2/hpf   RBC / HPF none seen 0-2/hpf      The ASCVD Risk score (Arnett DK, et al., 2019) failed to calculate for the following reasons:   The patient has a prior MI or stroke diagnosis    Assessment & Plan:   Problem List Items  Addressed This Visit       Cardiovascular and Mediastinum   Coronary artery disease involving native coronary artery of native heart without angina pectoris    Patient has history of the same currently on appropriate statin medication and aspirin.  He is followed by cardiology.      Relevant Medications   aspirin EC 81 MG tablet   Other Relevant Orders   Lipid panel (Completed)   Primary hypertension    Patient currently maintained on lisinopril 20 mg daily.  Continue medication as prescribed      Relevant Medications   aspirin EC 81 MG tablet   Other Relevant Orders   CBC (Completed)   Comprehensive metabolic panel (Completed)     Other   Decreased renal function    Decreased renal function pending renal panel today      Relevant Orders   Comprehensive metabolic panel (Completed)   DOE (dyspnea on exertion) - Primary    No change in got better not gotten worse was evaluated cardiology they did perform an echocardiogram and a heart catheterization.  Stable at this juncture      History of CVA (cerebrovascular accident)    History of CVA patient currently on aspirin and statin therapy      Relevant Orders   Lipid panel (Completed)   History of tobacco use    History of remote tobacco use.  Check urine microscopy for occult bleeding      Relevant Orders   Urine Microscopic (Completed)   Other Visit Diagnoses     Screening for prostate cancer       Relevant Orders   PSA, Medicare (Completed)       Return in about 1 year (around 09/22/2023) for CPE and Labs.    Audria Nine, NP

## 2022-09-22 NOTE — Assessment & Plan Note (Signed)
History of remote tobacco use.  Check urine microscopy for occult bleeding

## 2022-09-22 NOTE — Assessment & Plan Note (Signed)
Patient has history of the same currently on appropriate statin medication and aspirin.  He is followed by cardiology.

## 2022-09-22 NOTE — Assessment & Plan Note (Signed)
Decreased renal function pending renal panel today

## 2022-09-22 NOTE — Assessment & Plan Note (Signed)
Patient currently maintained on lisinopril 20 mg daily.  Continue medication as prescribed

## 2022-09-22 NOTE — Assessment & Plan Note (Signed)
No change in got better not gotten worse was evaluated cardiology they did perform an echocardiogram and a heart catheterization.  Stable at this juncture

## 2022-09-22 NOTE — Patient Instructions (Signed)
Nice to see you today I will be in touch with the labs once I have them Follow up with me in 1 year, sooner if you need me   

## 2022-11-17 ENCOUNTER — Ambulatory Visit (INDEPENDENT_AMBULATORY_CARE_PROVIDER_SITE_OTHER): Payer: Medicare Other

## 2022-11-17 VITALS — BP 118/62 | HR 84 | Resp 17 | Ht 72.0 in | Wt 179.8 lb

## 2022-11-17 DIAGNOSIS — Z0000 Encounter for general adult medical examination without abnormal findings: Secondary | ICD-10-CM | POA: Diagnosis not present

## 2022-11-17 NOTE — Patient Instructions (Signed)
Eric Bradley , Thank you for taking time to come for your Medicare Wellness Visit. I appreciate your ongoing commitment to your health goals. Please review the following plan we discussed and let me know if I can assist you in the future.   These are the goals we discussed:  Goals      Patient Stated     Drink 6-8 glasses of water a day, eat more fruits and vegetables.        This is a list of the screening recommended for you and due dates:  Health Maintenance  Topic Date Due   Hepatitis C Screening  Never done   COVID-19 Vaccine (5 - 2023-24 season) 01/22/2022   Flu Shot  12/23/2022   Medicare Annual Wellness Visit  11/17/2023   DTaP/Tdap/Td vaccine (3 - Td or Tdap) 09/15/2027   Pneumonia Vaccine  Completed   Zoster (Shingles) Vaccine  Completed   HPV Vaccine  Aged Out   Colon Cancer Screening  Discontinued    Advanced directives: Please bring a copy of your health care power of attorney and living will to the office to be added to your chart at your convenience.   Conditions/risks identified: Aim for 30 minutes of exercise or brisk walking, 6-8 glasses of water, and 5 servings of fruits and vegetables each day.   Next appointment: Follow up in one year for your annual wellness visit. 11/21/23 @ 8:45 telephone call  Preventive Care 65 Years and Older, Male  Preventive care refers to lifestyle choices and visits with your health care provider that can promote health and wellness. What does preventive care include? A yearly physical exam. This is also called an annual well check. Dental exams once or twice a year. Routine eye exams. Ask your health care provider how often you should have your eyes checked. Personal lifestyle choices, including: Daily care of your teeth and gums. Regular physical activity. Eating a healthy diet. Avoiding tobacco and drug use. Limiting alcohol use. Practicing safe sex. Taking low doses of aspirin every day. Taking vitamin and mineral  supplements as recommended by your health care provider. What happens during an annual well check? The services and screenings done by your health care provider during your annual well check will depend on your age, overall health, lifestyle risk factors, and family history of disease. Counseling  Your health care provider may ask you questions about your: Alcohol use. Tobacco use. Drug use. Emotional well-being. Home and relationship well-being. Sexual activity. Eating habits. History of falls. Memory and ability to understand (cognition). Work and work Astronomer. Screening  You may have the following tests or measurements: Height, weight, and BMI. Blood pressure. Lipid and cholesterol levels. These may be checked every 5 years, or more frequently if you are over 46 years old. Skin check. Lung cancer screening. You may have this screening every year starting at age 77 if you have a 30-pack-year history of smoking and currently smoke or have quit within the past 15 years. Fecal occult blood test (FOBT) of the stool. You may have this test every year starting at age 77. Flexible sigmoidoscopy or colonoscopy. You may have a sigmoidoscopy every 5 years or a colonoscopy every 10 years starting at age 77. Prostate cancer screening. Recommendations will vary depending on your family history and other risks. Hepatitis C blood test. Hepatitis B blood test. Sexually transmitted disease (STD) testing. Diabetes screening. This is done by checking your blood sugar (glucose) after you have not eaten for a  while (fasting). You may have this done every 1-3 years. Abdominal aortic aneurysm (AAA) screening. You may need this if you are a current or former smoker. Osteoporosis. You may be screened starting at age 77 if you are at high risk. Talk with your health care provider about your test results, treatment options, and if necessary, the need for more tests. Vaccines  Your health care provider  may recommend certain vaccines, such as: Influenza vaccine. This is recommended every year. Tetanus, diphtheria, and acellular pertussis (Tdap, Td) vaccine. You may need a Td booster every 10 years. Zoster vaccine. You may need this after age 77. Pneumococcal 13-valent conjugate (PCV13) vaccine. One dose is recommended after age 77. Pneumococcal polysaccharide (PPSV23) vaccine. One dose is recommended after age 77. Talk to your health care provider about which screenings and vaccines you need and how often you need them. This information is not intended to replace advice given to you by your health care provider. Make sure you discuss any questions you have with your health care provider. Document Released: 06/06/2015 Document Revised: 01/28/2016 Document Reviewed: 03/11/2015 Elsevier Interactive Patient Education  2017 [place] Prevention in the Home Falls can cause injuries. They can happen to people of all ages. There are many things you can do to make your home safe and to help prevent falls. What can I do on the outside of my home? Regularly fix the edges of walkways and driveways and fix any cracks. Remove anything that might make you trip as you walk through a door, such as a raised step or threshold. Trim any bushes or trees on the path to your home. Use bright outdoor lighting. Clear any walking paths of anything that might make someone trip, such as rocks or tools. Regularly check to see if handrails are loose or broken. Make sure that both sides of any steps have handrails. Any raised decks and porches should have guardrails on the edges. Have any leaves, snow, or ice cleared regularly. Use sand or salt on walking paths during winter. Clean up any spills in your garage right away. This includes oil or grease spills. What can I do in the bathroom? Use night lights. Install grab bars by the toilet and in the tub and shower. Do not use towel bars as grab bars. Use  non-skid mats or decals in the tub or shower. If you need to sit down in the shower, use a plastic, non-slip stool. Keep the floor dry. Clean up any water that spills on the floor as soon as it happens. Remove soap buildup in the tub or shower regularly. Attach bath mats securely with double-sided non-slip rug tape. Do not have throw rugs and other things on the floor that can make you trip. What can I do in the bedroom? Use night lights. Make sure that you have a light by your bed that is easy to reach. Do not use any sheets or blankets that are too big for your bed. They should not hang down onto the floor. Have a firm chair that has side arms. You can use this for support while you get dressed. Do not have throw rugs and other things on the floor that can make you trip. What can I do in the kitchen? Clean up any spills right away. Avoid walking on wet floors. Keep items that you use a lot in easy-to-reach places. If you need to reach something above you, use a strong step stool that has a grab  bar. Keep electrical cords out of the way. Do not use floor polish or wax that makes floors slippery. If you must use wax, use non-skid floor wax. Do not have throw rugs and other things on the floor that can make you trip. What can I do with my stairs? Do not leave any items on the stairs. Make sure that there are handrails on both sides of the stairs and use them. Fix handrails that are broken or loose. Make sure that handrails are as long as the stairways. Check any carpeting to make sure that it is firmly attached to the stairs. Fix any carpet that is loose or worn. Avoid having throw rugs at the top or bottom of the stairs. If you do have throw rugs, attach them to the floor with carpet tape. Make sure that you have a light switch at the top of the stairs and the bottom of the stairs. If you do not have them, ask someone to add them for you. What else can I do to help prevent falls? Wear  shoes that: Do not have high heels. Have rubber bottoms. Are comfortable and fit you well. Are closed at the toe. Do not wear sandals. If you use a stepladder: Make sure that it is fully opened. Do not climb a closed stepladder. Make sure that both sides of the stepladder are locked into place. Ask someone to hold it for you, if possible. Clearly mark and make sure that you can see: Any grab bars or handrails. First and last steps. Where the edge of each step is. Use tools that help you move around (mobility aids) if they are needed. These include: Canes. Walkers. Scooters. Crutches. Turn on the lights when you go into a dark area. Replace any light bulbs as soon as they burn out. Set up your furniture so you have a clear path. Avoid moving your furniture around. If any of your floors are uneven, fix them. If there are any pets around you, be aware of where they are. Review your medicines with your doctor. Some medicines can make you feel dizzy. This can increase your chance of falling. Ask your doctor what other things that you can do to help prevent falls. This information is not intended to replace advice given to you by your health care provider. Make sure you discuss any questions you have with your health care provider. Document Released: 03/06/2009 Document Revised: 10/16/2015 Document Reviewed: 06/14/2014 Elsevier Interactive Patient Education  2017 [employer].
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
## 2022-12-07 ENCOUNTER — Other Ambulatory Visit: Payer: Self-pay | Admitting: Nurse Practitioner

## 2022-12-08 NOTE — Telephone Encounter (Signed)
Does not show that this medication has been prescribed by anyone in this office.    LAST APPOINTMENT DATE: 09/22/22   NEXT APPOINTMENT DATE: 09/23/23  SILDENAFIL 20mg    LAST REFILL: Unknown. Ordered on 04/29/20  QTY: Unknown.

## 2022-12-15 DIAGNOSIS — H53143 Visual discomfort, bilateral: Secondary | ICD-10-CM | POA: Diagnosis not present

## 2022-12-15 DIAGNOSIS — H40052 Ocular hypertension, left eye: Secondary | ICD-10-CM | POA: Diagnosis not present

## 2022-12-15 DIAGNOSIS — H35371 Puckering of macula, right eye: Secondary | ICD-10-CM | POA: Diagnosis not present

## 2022-12-15 DIAGNOSIS — H25013 Cortical age-related cataract, bilateral: Secondary | ICD-10-CM | POA: Diagnosis not present

## 2022-12-16 DIAGNOSIS — C44519 Basal cell carcinoma of skin of other part of trunk: Secondary | ICD-10-CM | POA: Diagnosis not present

## 2022-12-16 DIAGNOSIS — Z85828 Personal history of other malignant neoplasm of skin: Secondary | ICD-10-CM | POA: Diagnosis not present

## 2022-12-16 DIAGNOSIS — D1801 Hemangioma of skin and subcutaneous tissue: Secondary | ICD-10-CM | POA: Diagnosis not present

## 2022-12-16 DIAGNOSIS — L821 Other seborrheic keratosis: Secondary | ICD-10-CM | POA: Diagnosis not present

## 2022-12-16 DIAGNOSIS — L814 Other melanin hyperpigmentation: Secondary | ICD-10-CM | POA: Diagnosis not present

## 2022-12-16 DIAGNOSIS — C44619 Basal cell carcinoma of skin of left upper limb, including shoulder: Secondary | ICD-10-CM | POA: Diagnosis not present

## 2023-02-08 DIAGNOSIS — M4125 Other idiopathic scoliosis, thoracolumbar region: Secondary | ICD-10-CM | POA: Diagnosis not present

## 2023-02-08 DIAGNOSIS — M11262 Other chondrocalcinosis, left knee: Secondary | ICD-10-CM | POA: Diagnosis not present

## 2023-02-08 DIAGNOSIS — M5030 Other cervical disc degeneration, unspecified cervical region: Secondary | ICD-10-CM | POA: Diagnosis not present

## 2023-02-08 DIAGNOSIS — M5136 Other intervertebral disc degeneration, lumbar region: Secondary | ICD-10-CM | POA: Diagnosis not present

## 2023-02-08 DIAGNOSIS — M11261 Other chondrocalcinosis, right knee: Secondary | ICD-10-CM | POA: Diagnosis not present

## 2023-02-08 DIAGNOSIS — M25552 Pain in left hip: Secondary | ICD-10-CM | POA: Diagnosis not present

## 2023-02-08 DIAGNOSIS — Z87891 Personal history of nicotine dependence: Secondary | ICD-10-CM | POA: Diagnosis not present

## 2023-02-08 DIAGNOSIS — M5134 Other intervertebral disc degeneration, thoracic region: Secondary | ICD-10-CM | POA: Diagnosis not present

## 2023-02-08 DIAGNOSIS — M4150 Other secondary scoliosis, site unspecified: Secondary | ICD-10-CM | POA: Diagnosis not present

## 2023-02-08 DIAGNOSIS — M7918 Myalgia, other site: Secondary | ICD-10-CM | POA: Diagnosis not present

## 2023-03-04 DIAGNOSIS — Z23 Encounter for immunization: Secondary | ICD-10-CM | POA: Diagnosis not present

## 2023-05-04 DIAGNOSIS — M5116 Intervertebral disc disorders with radiculopathy, lumbar region: Secondary | ICD-10-CM | POA: Diagnosis not present

## 2023-05-04 DIAGNOSIS — G9589 Other specified diseases of spinal cord: Secondary | ICD-10-CM | POA: Diagnosis not present

## 2023-05-04 DIAGNOSIS — M4186 Other forms of scoliosis, lumbar region: Secondary | ICD-10-CM | POA: Diagnosis not present

## 2023-05-04 DIAGNOSIS — M9934 Osseous stenosis of neural canal of sacral region: Secondary | ICD-10-CM | POA: Diagnosis not present

## 2023-05-04 DIAGNOSIS — M4150 Other secondary scoliosis, site unspecified: Secondary | ICD-10-CM | POA: Diagnosis not present

## 2023-05-04 DIAGNOSIS — M9933 Osseous stenosis of neural canal of lumbar region: Secondary | ICD-10-CM | POA: Diagnosis not present

## 2023-05-04 DIAGNOSIS — M7918 Myalgia, other site: Secondary | ICD-10-CM | POA: Diagnosis not present

## 2023-05-04 DIAGNOSIS — M48061 Spinal stenosis, lumbar region without neurogenic claudication: Secondary | ICD-10-CM | POA: Diagnosis not present

## 2023-05-04 DIAGNOSIS — M21372 Foot drop, left foot: Secondary | ICD-10-CM | POA: Diagnosis not present

## 2023-06-02 DIAGNOSIS — Z8774 Personal history of (corrected) congenital malformations of heart and circulatory system: Secondary | ICD-10-CM | POA: Diagnosis not present

## 2023-06-02 DIAGNOSIS — I63439 Cerebral infarction due to embolism of unspecified posterior cerebral artery: Secondary | ICD-10-CM | POA: Diagnosis not present

## 2023-06-02 DIAGNOSIS — Q2112 Patent foramen ovale: Secondary | ICD-10-CM | POA: Diagnosis not present

## 2023-06-02 DIAGNOSIS — I350 Nonrheumatic aortic (valve) stenosis: Secondary | ICD-10-CM | POA: Diagnosis not present

## 2023-06-02 DIAGNOSIS — I253 Aneurysm of heart: Secondary | ICD-10-CM | POA: Diagnosis not present

## 2023-07-26 DIAGNOSIS — I350 Nonrheumatic aortic (valve) stenosis: Secondary | ICD-10-CM | POA: Diagnosis not present

## 2023-07-26 DIAGNOSIS — R000 Tachycardia, unspecified: Secondary | ICD-10-CM | POA: Diagnosis not present

## 2023-07-26 DIAGNOSIS — Q2112 Patent foramen ovale: Secondary | ICD-10-CM | POA: Diagnosis not present

## 2023-07-26 DIAGNOSIS — R002 Palpitations: Secondary | ICD-10-CM | POA: Diagnosis not present

## 2023-07-26 DIAGNOSIS — I2510 Atherosclerotic heart disease of native coronary artery without angina pectoris: Secondary | ICD-10-CM | POA: Diagnosis not present

## 2023-07-26 DIAGNOSIS — I253 Aneurysm of heart: Secondary | ICD-10-CM | POA: Diagnosis not present

## 2023-07-26 DIAGNOSIS — Z8774 Personal history of (corrected) congenital malformations of heart and circulatory system: Secondary | ICD-10-CM | POA: Diagnosis not present

## 2023-07-29 DIAGNOSIS — R000 Tachycardia, unspecified: Secondary | ICD-10-CM | POA: Diagnosis not present

## 2023-08-05 DIAGNOSIS — R000 Tachycardia, unspecified: Secondary | ICD-10-CM | POA: Diagnosis not present

## 2023-09-23 ENCOUNTER — Encounter: Payer: Medicare Other | Admitting: Nurse Practitioner

## 2023-09-30 ENCOUNTER — Ambulatory Visit (INDEPENDENT_AMBULATORY_CARE_PROVIDER_SITE_OTHER): Payer: Medicare Other | Admitting: Nurse Practitioner

## 2023-09-30 ENCOUNTER — Encounter: Payer: Self-pay | Admitting: Nurse Practitioner

## 2023-09-30 VITALS — BP 134/78 | HR 53 | Temp 98.2°F | Ht 69.0 in | Wt 180.4 lb

## 2023-09-30 DIAGNOSIS — I10 Essential (primary) hypertension: Secondary | ICD-10-CM | POA: Diagnosis not present

## 2023-09-30 DIAGNOSIS — Z8673 Personal history of transient ischemic attack (TIA), and cerebral infarction without residual deficits: Secondary | ICD-10-CM | POA: Diagnosis not present

## 2023-09-30 DIAGNOSIS — R0609 Other forms of dyspnea: Secondary | ICD-10-CM

## 2023-09-30 DIAGNOSIS — Z125 Encounter for screening for malignant neoplasm of prostate: Secondary | ICD-10-CM

## 2023-09-30 DIAGNOSIS — I350 Nonrheumatic aortic (valve) stenosis: Secondary | ICD-10-CM | POA: Diagnosis not present

## 2023-09-30 DIAGNOSIS — Z87891 Personal history of nicotine dependence: Secondary | ICD-10-CM | POA: Diagnosis not present

## 2023-09-30 DIAGNOSIS — I2510 Atherosclerotic heart disease of native coronary artery without angina pectoris: Secondary | ICD-10-CM

## 2023-09-30 LAB — COMPREHENSIVE METABOLIC PANEL WITH GFR
ALT: 17 U/L (ref 0–53)
AST: 19 U/L (ref 0–37)
Albumin: 4.3 g/dL (ref 3.5–5.2)
Alkaline Phosphatase: 39 U/L (ref 39–117)
BUN: 36 mg/dL — ABNORMAL HIGH (ref 6–23)
CO2: 26 meq/L (ref 19–32)
Calcium: 9.1 mg/dL (ref 8.4–10.5)
Chloride: 103 meq/L (ref 96–112)
Creatinine, Ser: 1.53 mg/dL — ABNORMAL HIGH (ref 0.40–1.50)
GFR: 43.39 mL/min — ABNORMAL LOW (ref >60.00)
Glucose, Bld: 95 mg/dL (ref 70–99)
Potassium: 4.7 meq/L (ref 3.5–5.1)
Sodium: 136 meq/L (ref 135–145)
Total Bilirubin: 1.7 mg/dL — ABNORMAL HIGH (ref 0.2–1.2)
Total Protein: 6.5 g/dL (ref 6.0–8.3)

## 2023-09-30 LAB — URINALYSIS, MICROSCOPIC ONLY

## 2023-09-30 LAB — LIPID PANEL
Cholesterol: 148 mg/dL (ref 0–200)
HDL: 54.7 mg/dL (ref >39.00)
LDL Cholesterol: 80 mg/dL (ref 0–99)
NonHDL: 93.22
Total CHOL/HDL Ratio: 3
Triglycerides: 64 mg/dL (ref 0.0–149.0)
VLDL: 12.8 mg/dL (ref 0.0–40.0)

## 2023-09-30 LAB — CBC
HCT: 41.7 % (ref 39.0–52.0)
Hemoglobin: 13.9 g/dL (ref 13.0–17.0)
MCHC: 33.3 g/dL (ref 30.0–36.0)
MCV: 85.5 fl (ref 78.0–100.0)
Platelets: 206 10*3/uL (ref 150.0–400.0)
RBC: 4.88 Mil/uL (ref 4.22–5.81)
RDW: 14.3 % (ref 11.5–15.5)
WBC: 4.6 10*3/uL (ref 4.0–10.5)

## 2023-09-30 LAB — PSA, MEDICARE: PSA: 1.14 ng/mL (ref 0.10–4.00)

## 2023-09-30 LAB — TSH: TSH: 0.58 u[IU]/mL (ref 0.35–5.50)

## 2023-09-30 NOTE — Assessment & Plan Note (Signed)
 History of the same.  Patient is followed by cardiology.  He is on rosuvastatin 20 mg daily and exercising appropriate amount.  Continue taking medication as prescribed and working on healthy lifestyle modifications

## 2023-09-30 NOTE — Assessment & Plan Note (Signed)
 Patient currently maintained on lisinopril-hydrochlorothiazide 20-12.5 mg daily.  Continue medication as prescribed.  Blood pressure controlled

## 2023-09-30 NOTE — Assessment & Plan Note (Signed)
 History of the same.  Pending urine microscopy rule out microscopic hematuria

## 2023-09-30 NOTE — Assessment & Plan Note (Signed)
 Murmur noted during exam

## 2023-09-30 NOTE — Assessment & Plan Note (Signed)
 History of pain stable at this juncture patient has been evaluated by cardiology

## 2023-09-30 NOTE — Progress Notes (Signed)
 Established Patient Office Visit  Subjective   Patient ID: Eric Bradley, male    DOB: 1946/01/20  Age: 78 y.o. MRN: [id number]  Chief Complaint  Patient presents with   Annual Exam    HPI  HTN: Patient currently maintained on linsinopril- hctz 20-12.5 mg daily. Does check blood pressure at home   CAD: Patient currently maintained on rosuvastatin 20 mg daily.  Tachycardia patient currently followed by cardiology.  He did undergo heart monitor and state it was nothing to concern for his report.  They did switch his lisinopril to combination pill  GERD: Patient, maintained on Protonix 40 mg as needed. Tiwce a month mabey  History of CVA: Patient currently on statin and baby aspirin .  for complete physical and follow up of chronic conditions.  Immunizations: -Tetanus: Completed in 2019 -Influenza: out of season  -Shingles: Completed Shingrix series -Pneumonia: Completed   Diet: Fair diet. He is eating 2-3 meals a day and sometimes snacks. He is drinking coffee (2) and then water   Exercise: No regular exercise. Golf and gym 6 days a week 50 mins session   Eye exam: Completes annually. Glasses   Dental exam: Completes semi-annually    Colonoscopy: Completed in 2015 with 10 year recall  Lung Cancer Screening: Completed in   PSA: Due  Sleep: goes to bed around 930-10 and wakes up 7. Feels rested. States that he intermittently  Glands: states that on the left side that he noticed was hard. Took ibuprofen that went away. It was last week when he noticed this.  No sick symptoms or allergy symptoms besides normal.     Review of Systems  Constitutional:  Negative for chills and fever.  Respiratory:  Positive for shortness of breath (DOE).   Cardiovascular:  Negative for chest pain and leg swelling.  Gastrointestinal:  Negative for abdominal pain, blood in stool, constipation, diarrhea, nausea and vomiting.       BM daily   Genitourinary:  Negative for dysuria and  hematuria.       Noctuira 2  Neurological:  Negative for tingling and headaches.  Psychiatric/Behavioral:  Negative for hallucinations and suicidal ideas.       Objective:     BP 134/78   Pulse (!) 53   Temp 98.2 F (36.8 C) (Oral)   Ht 5\' 9"  (1.753 m)   Wt 180 lb 6 oz (81.8 kg)   SpO2 99%   BMI 26.64 kg/m  BP Readings from Last 3 Encounters:  09/30/23 134/78  11/17/22 118/62  09/22/22 118/60   Wt Readings from Last 3 Encounters:  09/30/23 180 lb 6 oz (81.8 kg)  11/17/22 179 lb 12.8 oz (81.6 kg)  09/22/22 182 lb 8 oz (82.8 kg)   SpO2 Readings from Last 3 Encounters:  09/30/23 99%  11/17/22 97%  09/22/22 99%      Physical Exam Vitals and nursing note reviewed.  Constitutional:      Appearance: Normal appearance.  HENT:     Right Ear: Tympanic membrane, ear canal and external ear normal.     Left Ear: Tympanic membrane, ear canal and external ear normal.     Mouth/Throat:     Mouth: Mucous membranes are moist.     Pharynx: Oropharynx is clear.  Eyes:     Extraocular Movements: Extraocular movements intact.     Pupils: Pupils are equal, round, and reactive to light.  Cardiovascular:     Rate and Rhythm: Normal rate and regular rhythm.  Pulses: Normal pulses.     Heart sounds: Murmur heard.  Pulmonary:     Effort: Pulmonary effort is normal.     Breath sounds: Normal breath sounds.  Abdominal:     General: Bowel sounds are normal. There is no distension.     Palpations: There is no mass.     Tenderness: There is no abdominal tenderness.     Hernia: No hernia is present.  Genitourinary:    Comments: deferred Musculoskeletal:     Right lower leg: No edema.     Left lower leg: No edema.  Lymphadenopathy:     Cervical: No cervical adenopathy.  Skin:    General: Skin is warm.  Neurological:     General: No focal deficit present.     Mental Status: He is alert.     Deep Tendon Reflexes:     Reflex Scores:      Bicep reflexes are 2+ on the right side  and 2+ on the left side.      Patellar reflexes are 2+ on the right side and 2+ on the left side.    Comments: Bilateral upper and lower extremity strength 5/5  Psychiatric:        Mood and Affect: Mood normal.        Behavior: Behavior normal.        Thought Content: Thought content normal.        Judgment: Judgment normal.      No results found for any visits on 09/30/23.    The ASCVD Risk score (Arnett DK, et al., 2019) failed to calculate for the following reasons:   Risk score cannot be calculated because patient has a medical history suggesting prior/existing ASCVD    Assessment & Plan:   Problem List Items Addressed This Visit       Cardiovascular and Mediastinum   Coronary artery disease involving native coronary artery of native heart without angina pectoris - Primary   History of the same.  Patient is followed by cardiology.  He is on rosuvastatin 20 mg daily and exercising appropriate amount.  Continue taking medication as prescribed and working on healthy lifestyle modifications      Relevant Medications   lisinopril-hydrochlorothiazide (ZESTORETIC) 20-12.5 MG tablet   Other Relevant Orders   Lipid panel   Aortic stenosis, mild   Murmur noted during exam      Relevant Medications   lisinopril-hydrochlorothiazide (ZESTORETIC) 20-12.5 MG tablet   Primary hypertension   Patient currently maintained on lisinopril-hydrochlorothiazide 20-12.5 mg daily.  Continue medication as prescribed.  Blood pressure controlled      Relevant Medications   lisinopril-hydrochlorothiazide (ZESTORETIC) 20-12.5 MG tablet   Other Relevant Orders   CBC   Comprehensive metabolic panel with GFR   TSH     Other   DOE (dyspnea on exertion)   History of pain stable at this juncture patient has been evaluated by cardiology      History of CVA (cerebrovascular accident)   History of same.  Patient on high intensity statin and baby aspirin .  Continue both medications continue  working on healthy lifestyle modifications      History of tobacco use   History of the same.  Pending urine microscopy rule out microscopic hematuria      Relevant Orders   Urine Microscopic   Other Visit Diagnoses       Screening for prostate cancer       Relevant Orders   PSA, Medicare  Return in about 1 year (around 09/29/2024) for CPE and Labs.    Margarie Shay, NP

## 2023-09-30 NOTE — Assessment & Plan Note (Signed)
 History of same.  Patient on high intensity statin and baby aspirin .  Continue both medications continue working on healthy lifestyle modifications

## 2023-09-30 NOTE — Patient Instructions (Signed)
 Nice to see you today I will be in touch with the labs once I have them Follow up with me in 1 year, sooner if you need me

## 2023-10-03 ENCOUNTER — Other Ambulatory Visit: Payer: Self-pay | Admitting: Nurse Practitioner

## 2023-10-03 ENCOUNTER — Encounter: Payer: Self-pay | Admitting: Nurse Practitioner

## 2023-10-03 DIAGNOSIS — N183 Chronic kidney disease, stage 3 unspecified: Secondary | ICD-10-CM

## 2023-10-04 ENCOUNTER — Ambulatory Visit: Payer: Self-pay

## 2023-10-06 DIAGNOSIS — D1801 Hemangioma of skin and subcutaneous tissue: Secondary | ICD-10-CM | POA: Diagnosis not present

## 2023-10-06 DIAGNOSIS — D485 Neoplasm of uncertain behavior of skin: Secondary | ICD-10-CM | POA: Diagnosis not present

## 2023-10-06 DIAGNOSIS — L821 Other seborrheic keratosis: Secondary | ICD-10-CM | POA: Diagnosis not present

## 2023-10-06 DIAGNOSIS — L814 Other melanin hyperpigmentation: Secondary | ICD-10-CM | POA: Diagnosis not present

## 2023-10-06 DIAGNOSIS — C44519 Basal cell carcinoma of skin of other part of trunk: Secondary | ICD-10-CM | POA: Diagnosis not present

## 2023-10-06 DIAGNOSIS — L570 Actinic keratosis: Secondary | ICD-10-CM | POA: Diagnosis not present

## 2023-10-06 DIAGNOSIS — L720 Epidermal cyst: Secondary | ICD-10-CM | POA: Diagnosis not present

## 2023-10-06 DIAGNOSIS — D692 Other nonthrombocytopenic purpura: Secondary | ICD-10-CM | POA: Diagnosis not present

## 2023-10-06 DIAGNOSIS — Z85828 Personal history of other malignant neoplasm of skin: Secondary | ICD-10-CM | POA: Diagnosis not present

## 2023-10-13 DIAGNOSIS — Z96642 Presence of left artificial hip joint: Secondary | ICD-10-CM | POA: Diagnosis not present

## 2023-10-13 DIAGNOSIS — M545 Low back pain, unspecified: Secondary | ICD-10-CM | POA: Diagnosis not present

## 2023-10-13 DIAGNOSIS — M25552 Pain in left hip: Secondary | ICD-10-CM | POA: Diagnosis not present

## 2023-10-25 ENCOUNTER — Encounter: Payer: Self-pay | Admitting: Nurse Practitioner

## 2023-10-26 MED ORDER — PANTOPRAZOLE SODIUM 40 MG PO TBEC: 40.0000 mg | DELAYED_RELEASE_TABLET | ORAL | 1 refills | Status: AC | PRN

## 2023-11-07 ENCOUNTER — Encounter: Payer: Self-pay | Admitting: Nurse Practitioner

## 2023-11-07 DIAGNOSIS — Z96642 Presence of left artificial hip joint: Secondary | ICD-10-CM | POA: Diagnosis not present

## 2023-11-09 ENCOUNTER — Ambulatory Visit
Admission: RE | Admit: 2023-11-09 | Discharge: 2023-11-09 | Disposition: A | Discharge status: Home / Self Care | Source: Ambulatory Visit | Attending: Nurse Practitioner

## 2023-11-09 ENCOUNTER — Ambulatory Visit (INDEPENDENT_AMBULATORY_CARE_PROVIDER_SITE_OTHER): Admitting: Nurse Practitioner

## 2023-11-09 VITALS — BP 130/70 | HR 72 | Temp 97.7°F | Ht 69.0 in | Wt 184.4 lb

## 2023-11-09 DIAGNOSIS — R198 Other specified symptoms and signs involving the digestive system and abdomen: Secondary | ICD-10-CM | POA: Diagnosis not present

## 2023-11-09 DIAGNOSIS — I700 Atherosclerosis of aorta: Secondary | ICD-10-CM | POA: Diagnosis not present

## 2023-11-09 NOTE — Assessment & Plan Note (Signed)
 Patient had increased loose stools with some urgency.  Over the past 2 days is resolved.  He has a history of the same.  Will check abdomen 1 view for stool burden.  He is due for colonoscopy he will reach out to the [facility] GI clinic as his provider from that clinic retired.  If needs a referral he will reach out to me.  Patient declined blood work as he is not having lots of water  or diarrhea.  Agreeable with that plan

## 2023-11-09 NOTE — Progress Notes (Signed)
   Acute Office Visit  Subjective:     Patient ID: Eric Bradley, male    DOB: 25-Jul-1945, 78 y.o.   MRN: [id number]  Chief Complaint  Patient presents with  . frequent bowel movements    Pt complains of having many bowel movements but states they stopped 2 days ago. Was ongoing for 2 weeks. No discomfort.     HPI Patient is in today for change in bowel habits with a history of PFO, CAD, CVA, hypertension, DOE.  Last colonoscopy Completed in 2015 with 10-year recall.  He was seen through [facility]  States that over the past two weeks he had has looser stools that would be frequent and some urgency. Statse that he has had a history of the same when he is eating greasy foods. States that he did try to alter his diet and it took it longer to do. States that over the past 2 days it has normalize   Review of Systems  Constitutional:  Negative for chills and fever.  Respiratory:  Negative for shortness of breath.   Cardiovascular:  Negative for chest pain.  Gastrointestinal:  Negative for abdominal pain, constipation, diarrhea, nausea and vomiting.  Neurological:  Negative for dizziness.        Objective:    BP 130/70   Pulse 72   Temp 97.7 F (36.5 C) (Oral)   Ht 5' 9 (1.753 m)   Wt 184 lb 6.4 oz (83.6 kg)   SpO2 97%   BMI 27.23 kg/m    Physical Exam Vitals and nursing note reviewed.  Constitutional:      Appearance: Normal appearance.   Cardiovascular:     Rate and Rhythm: Normal rate and regular rhythm.     Heart sounds: Normal heart sounds.  Pulmonary:     Effort: Pulmonary effort is normal.     Breath sounds: Normal breath sounds.  Abdominal:     General: There is no distension.     Palpations: Abdomen is soft. There is no mass.     Tenderness: There is no abdominal tenderness.     Hernia: No hernia is present.     Comments: BS WNL    Neurological:     Mental Status: He is alert.    No results found for any visits on 11/09/23.       Assessment & Plan:   Problem List Items Addressed This Visit       Other   Change in bowel movement - Primary   Patient had increased loose stools with some urgency.  Over the past 2 days is resolved.  He has a history of the same.  Will check abdomen 1 view for stool burden.  He is due for colonoscopy he will reach out to the [facility] clinic as his provider from that clinic retired.  If needs a referral he will reach out to me.  Patient declined blood work as he is not having lots of water  or diarrhea.  Agreeable with that plan      Relevant Orders   DG Abd 1 View    No orders of the defined types were placed in this encounter.   Return if symptoms worsen or fail to improve.  Margarie Shay, NP

## 2023-11-16 DIAGNOSIS — Z96642 Presence of left artificial hip joint: Secondary | ICD-10-CM | POA: Diagnosis not present

## 2023-11-16 DIAGNOSIS — Z471 Aftercare following joint replacement surgery: Secondary | ICD-10-CM | POA: Diagnosis not present

## 2023-11-21 ENCOUNTER — Ambulatory Visit: Payer: Self-pay | Admitting: Nurse Practitioner

## 2023-12-08 DIAGNOSIS — R197 Diarrhea, unspecified: Secondary | ICD-10-CM | POA: Diagnosis not present

## 2023-12-08 DIAGNOSIS — I350 Nonrheumatic aortic (valve) stenosis: Secondary | ICD-10-CM | POA: Diagnosis not present

## 2023-12-08 DIAGNOSIS — K5900 Constipation, unspecified: Secondary | ICD-10-CM | POA: Diagnosis not present

## 2023-12-08 DIAGNOSIS — Q2112 Patent foramen ovale: Secondary | ICD-10-CM | POA: Diagnosis not present

## 2023-12-08 DIAGNOSIS — Z1211 Encounter for screening for malignant neoplasm of colon: Secondary | ICD-10-CM | POA: Diagnosis not present

## 2024-01-02 DIAGNOSIS — X58XXXA Exposure to other specified factors, initial encounter: Secondary | ICD-10-CM | POA: Diagnosis not present

## 2024-01-02 DIAGNOSIS — S76302A Unspecified injury of muscle, fascia and tendon of the posterior muscle group at thigh level, left thigh, initial encounter: Secondary | ICD-10-CM | POA: Diagnosis not present

## 2024-01-04 DIAGNOSIS — H40012 Open angle with borderline findings, low risk, left eye: Secondary | ICD-10-CM | POA: Diagnosis not present

## 2024-01-04 DIAGNOSIS — H2513 Age-related nuclear cataract, bilateral: Secondary | ICD-10-CM | POA: Diagnosis not present

## 2024-01-04 DIAGNOSIS — H35371 Puckering of macula, right eye: Secondary | ICD-10-CM | POA: Diagnosis not present

## 2024-01-04 DIAGNOSIS — H40052 Ocular hypertension, left eye: Secondary | ICD-10-CM | POA: Diagnosis not present

## 2024-01-17 DIAGNOSIS — K5730 Diverticulosis of large intestine without perforation or abscess without bleeding: Secondary | ICD-10-CM | POA: Diagnosis not present

## 2024-01-17 DIAGNOSIS — Z1211 Encounter for screening for malignant neoplasm of colon: Secondary | ICD-10-CM | POA: Diagnosis not present

## 2024-01-17 DIAGNOSIS — K621 Rectal polyp: Secondary | ICD-10-CM | POA: Diagnosis not present

## 2024-01-19 DIAGNOSIS — K621 Rectal polyp: Secondary | ICD-10-CM | POA: Diagnosis not present

## 2024-03-04 ENCOUNTER — Other Ambulatory Visit: Payer: Self-pay | Admitting: Nurse Practitioner

## 2024-03-14 DIAGNOSIS — X58XXXA Exposure to other specified factors, initial encounter: Secondary | ICD-10-CM | POA: Diagnosis not present

## 2024-03-14 DIAGNOSIS — S76302A Unspecified injury of muscle, fascia and tendon of the posterior muscle group at thigh level, left thigh, initial encounter: Secondary | ICD-10-CM | POA: Diagnosis not present

## 2024-03-19 DIAGNOSIS — Z23 Encounter for immunization: Secondary | ICD-10-CM | POA: Diagnosis not present

## 2024-03-20 DIAGNOSIS — R0609 Other forms of dyspnea: Secondary | ICD-10-CM | POA: Diagnosis not present

## 2024-03-20 DIAGNOSIS — R079 Chest pain, unspecified: Secondary | ICD-10-CM | POA: Diagnosis not present

## 2024-03-20 DIAGNOSIS — I2510 Atherosclerotic heart disease of native coronary artery without angina pectoris: Secondary | ICD-10-CM | POA: Diagnosis not present

## 2024-03-20 DIAGNOSIS — I350 Nonrheumatic aortic (valve) stenosis: Secondary | ICD-10-CM | POA: Diagnosis not present

## 2024-03-20 DIAGNOSIS — Z8774 Personal history of (corrected) congenital malformations of heart and circulatory system: Secondary | ICD-10-CM | POA: Diagnosis not present

## 2024-03-23 ENCOUNTER — Other Ambulatory Visit

## 2024-03-23 DIAGNOSIS — N183 Chronic kidney disease, stage 3 unspecified: Secondary | ICD-10-CM | POA: Diagnosis not present

## 2024-03-23 LAB — BASIC METABOLIC PANEL WITH GFR
BUN: 28 mg/dL — ABNORMAL HIGH (ref 6–23)
CO2: 27 meq/L (ref 19–32)
Calcium: 9.3 mg/dL (ref 8.4–10.5)
Chloride: 101 meq/L (ref 96–112)
Creatinine, Ser: 1.61 mg/dL — ABNORMAL HIGH (ref 0.40–1.50)
GFR: 40.68 mL/min — ABNORMAL LOW (ref >60.00)
Glucose, Bld: 95 mg/dL (ref 70–99)
Potassium: 4.4 meq/L (ref 3.5–5.1)
Sodium: 136 meq/L (ref 135–145)

## 2024-03-27 ENCOUNTER — Ambulatory Visit: Payer: Self-pay | Admitting: Nurse Practitioner

## 2024-04-02 DIAGNOSIS — I2510 Atherosclerotic heart disease of native coronary artery without angina pectoris: Secondary | ICD-10-CM | POA: Diagnosis not present

## 2024-04-02 DIAGNOSIS — R079 Chest pain, unspecified: Secondary | ICD-10-CM | POA: Diagnosis not present

## 2024-10-02 ENCOUNTER — Encounter: Admitting: Nurse Practitioner

## 2024-10-18 ENCOUNTER — Encounter: Admitting: Nurse Practitioner
